# Patient Record
Sex: Male | Born: 1940 | Race: White | Hispanic: No | Smoking: Former smoker
Health system: Southern US, Community
[De-identification: ages and names within clinical notes are randomized; demographics above are authoritative.]

## PROBLEM LIST (undated history)

## (undated) DIAGNOSIS — F329 Major depressive disorder, single episode, unspecified: Secondary | ICD-10-CM

## (undated) DIAGNOSIS — E7800 Pure hypercholesterolemia, unspecified: Secondary | ICD-10-CM

## (undated) DIAGNOSIS — I10 Essential (primary) hypertension: Secondary | ICD-10-CM

## (undated) DIAGNOSIS — F419 Anxiety disorder, unspecified: Secondary | ICD-10-CM

## (undated) DIAGNOSIS — F32A Depression, unspecified: Secondary | ICD-10-CM

## (undated) HISTORY — DX: Anxiety disorder, unspecified: F41.9

## (undated) HISTORY — DX: Depression, unspecified: F32.A

## (undated) HISTORY — DX: Major depressive disorder, single episode, unspecified: F32.9

## (undated) HISTORY — PX: BACK SURGERY: SHX140

## (undated) HISTORY — PX: CYSTOSCOPY W/ RETROGRADES: SHX1426

---

## 2008-08-17 ENCOUNTER — Ambulatory Visit (HOSPITAL_COMMUNITY): Admission: RE | Admit: 2008-08-17 | Discharge: 2008-08-17 | Payer: Self-pay | Admitting: Family Medicine

## 2008-08-19 ENCOUNTER — Ambulatory Visit (HOSPITAL_COMMUNITY): Admission: RE | Admit: 2008-08-19 | Discharge: 2008-08-19 | Payer: Self-pay | Admitting: Family Medicine

## 2008-09-20 ENCOUNTER — Emergency Department (HOSPITAL_COMMUNITY): Admission: EM | Admit: 2008-09-20 | Discharge: 2008-09-20 | Payer: Self-pay | Admitting: Emergency Medicine

## 2009-02-28 ENCOUNTER — Ambulatory Visit (HOSPITAL_COMMUNITY): Admission: RE | Admit: 2009-02-28 | Discharge: 2009-02-28 | Payer: Self-pay | Admitting: Gastroenterology

## 2009-11-25 LAB — HM COLONOSCOPY

## 2010-08-09 ENCOUNTER — Encounter: Payer: Self-pay | Admitting: Family Medicine

## 2010-08-09 DIAGNOSIS — K5790 Diverticulosis of intestine, part unspecified, without perforation or abscess without bleeding: Secondary | ICD-10-CM

## 2010-08-09 DIAGNOSIS — K635 Polyp of colon: Secondary | ICD-10-CM

## 2010-08-22 LAB — URINE MICROSCOPIC-ADD ON

## 2010-08-22 LAB — URINE CULTURE

## 2010-08-22 LAB — URINALYSIS, ROUTINE W REFLEX MICROSCOPIC
Bilirubin Urine: NEGATIVE
Nitrite: POSITIVE — AB

## 2011-02-13 ENCOUNTER — Emergency Department (HOSPITAL_COMMUNITY)
Admission: EM | Admit: 2011-02-13 | Discharge: 2011-02-13 | Disposition: A | Discharge status: Home / Self Care | Payer: Medicare Other | Attending: Emergency Medicine | Admitting: Emergency Medicine

## 2011-02-13 DIAGNOSIS — E7800 Pure hypercholesterolemia, unspecified: Secondary | ICD-10-CM | POA: Insufficient documentation

## 2011-02-13 DIAGNOSIS — I10 Essential (primary) hypertension: Secondary | ICD-10-CM | POA: Insufficient documentation

## 2011-09-08 ENCOUNTER — Emergency Department (HOSPITAL_COMMUNITY)
Admission: EM | Admit: 2011-09-08 | Discharge: 2011-09-08 | Disposition: A | Discharge status: Home / Self Care | Payer: PRIVATE HEALTH INSURANCE | Attending: Emergency Medicine | Admitting: Emergency Medicine

## 2011-09-08 ENCOUNTER — Emergency Department (HOSPITAL_COMMUNITY): Payer: PRIVATE HEALTH INSURANCE

## 2011-09-08 ENCOUNTER — Encounter (HOSPITAL_COMMUNITY): Payer: Self-pay | Admitting: *Deleted

## 2011-09-08 DIAGNOSIS — E119 Type 2 diabetes mellitus without complications: Secondary | ICD-10-CM | POA: Insufficient documentation

## 2011-09-08 DIAGNOSIS — R011 Cardiac murmur, unspecified: Secondary | ICD-10-CM | POA: Insufficient documentation

## 2011-09-08 DIAGNOSIS — R739 Hyperglycemia, unspecified: Secondary | ICD-10-CM

## 2011-09-08 DIAGNOSIS — Z79899 Other long term (current) drug therapy: Secondary | ICD-10-CM | POA: Insufficient documentation

## 2011-09-08 DIAGNOSIS — R5381 Other malaise: Secondary | ICD-10-CM | POA: Insufficient documentation

## 2011-09-08 DIAGNOSIS — E789 Disorder of lipoprotein metabolism, unspecified: Secondary | ICD-10-CM | POA: Insufficient documentation

## 2011-09-08 DIAGNOSIS — R42 Dizziness and giddiness: Secondary | ICD-10-CM | POA: Insufficient documentation

## 2011-09-08 DIAGNOSIS — I10 Essential (primary) hypertension: Secondary | ICD-10-CM | POA: Insufficient documentation

## 2011-09-08 HISTORY — DX: Pure hypercholesterolemia, unspecified: E78.00

## 2011-09-08 HISTORY — DX: Essential (primary) hypertension: I10

## 2011-09-08 LAB — GLUCOSE, CAPILLARY: Glucose-Capillary: 146 mg/dL — ABNORMAL HIGH (ref 70–99)

## 2011-09-08 LAB — CBC
HCT: 41.1 % (ref 39.0–52.0)
MCH: 28.8 pg (ref 26.0–34.0)
RDW: 13.7 % (ref 11.5–15.5)
WBC: 4.3 10*3/uL (ref 4.0–10.5)

## 2011-09-08 LAB — BASIC METABOLIC PANEL
GFR calc Af Amer: >90 mL/min (ref >90)
Potassium: 4.5 mEq/L (ref 3.5–5.1)
Sodium: 141 mEq/L (ref 135–145)

## 2011-09-08 LAB — TROPONIN I: Troponin I: <0.3 ng/mL (ref <0.30)

## 2011-09-08 MED ORDER — SODIUM CHLORIDE 0.9 % IV BOLUS (SEPSIS)
250.0000 mL | Freq: Once | INTRAVENOUS | Status: AC
  Administered 2011-09-08: 250 mL via INTRAVENOUS

## 2011-09-08 MED ORDER — SODIUM CHLORIDE 0.9 % IV SOLN: INTRAVENOUS | Status: DC

## 2011-09-08 NOTE — ED Notes (Signed)
Pt states, "not feeling well" x 1 week. Unable to explain how initially, but then stated "it may be anxiety, it feels like it could be."

## 2011-09-08 NOTE — ED Provider Notes (Addendum)
History   This chart was scribed for Dennis Jakes, MD by Dennis Melton. The patient was seen in room APA05/APA05. Patient's care was started at 0905.    CSN: [id number]  Arrival date & time 09/08/11  0905   First MD Initiated Contact with Patient 09/08/11 [phone]      Chief Complaint  Patient presents with  . Hypertension    (Consider location/radiation/quality/duration/timing/severity/associated sxs/prior treatment) HPI Dennis Melton is a 71 y.o. male who presents to the Emergency Department complaining of intermittent gradual fatigue onset several days ago. Patient states that they have also experienced elevated blood pressure of a measure of 174 systolically onset this morning. Patient denies nausea, HA, sore throat, cough, neck pain, back pain, chest pain, SOB, belly pain, rash, and swelling in legs. Patient list PCP as Dennis Melton at [facility]   Past Medical History  Diagnosis Date  . Hypertension   . Diabetes mellitus   . High cholesterol     Past Surgical History  Procedure Date  . Back surgery     No family history on file.  History  Substance Use Topics  . Smoking status: Former Games developer  . Smokeless tobacco: Not on file  . Alcohol Use: No      Review of Systems  Constitutional: Positive for fatigue. Negative for fever and chills.  HENT: Negative for rhinorrhea and neck pain.   Eyes: Negative for pain.  Respiratory: Negative for cough and shortness of breath.   Cardiovascular: Negative for chest pain.  Gastrointestinal: Negative for nausea, vomiting, abdominal pain and diarrhea.  Genitourinary: Negative for dysuria.  Musculoskeletal: Negative for back pain.  Skin: Negative for rash.  Neurological: Negative for dizziness, weakness and headaches.    Allergies  Review of patient's allergies indicates no known allergies.  Home Medications   Current Outpatient Rx  Name Route Sig Dispense Refill  . ALPRAZOLAM 0.25 MG PO TABS Oral Take 0.25 mg by mouth 3  (three) times daily as needed.      . ATENOLOL 25 MG PO TABS Oral Take 25 mg by mouth daily.      Marland Kitchen VITAMIN D 1000 UNITS PO TABS Oral Take 1,000 Units by mouth daily.      Marland Kitchen TAMSULOSIN HCL 0.4 MG PO CAPS Oral Take by mouth daily.      Marland Kitchen VALSARTAN-HYDROCHLOROTHIAZIDE 160-12.5 MG PO TABS Oral Take 1 tablet by mouth daily.        BP 136/67  Pulse 63  Temp(Src) 98 F (36.7 C) (Oral)  Resp 18  Ht 6' (1.829 m)  Wt 208 lb (94.348 kg)  BMI 28.21 kg/m2  SpO2 94%  Physical Exam  Nursing note and vitals reviewed. Constitutional: He is oriented to person, place, and time. He appears well-developed and well-nourished. No distress.  HENT:  Head: Normocephalic and atraumatic.  Eyes: Conjunctivae and EOM are normal. Pupils are equal, round, and reactive to light.  Neck: Neck supple. No tracheal deviation present.  Cardiovascular: Normal rate and regular rhythm.   Murmur (Systolic) heard. Pulmonary/Chest: Effort normal. No respiratory distress. He has no wheezes. He has no rales. He exhibits no tenderness.  Abdominal: Soft. He exhibits no distension and no mass. There is no tenderness. There is no rebound and no guarding.  Musculoskeletal: Normal range of motion. He exhibits no edema.  Neurological: He is alert and oriented to person, place, and time. No cranial nerve deficit or sensory deficit. Coordination normal.  Skin: Skin is warm and dry.  Psychiatric: He  has a normal mood and affect. His behavior is normal.    ED Course  Procedures (including critical care time) DIAGNOSTIC STUDIES:  COORDINATION OF CARE:  Labs Reviewed  GLUCOSE, CAPILLARY - Abnormal; Notable for the following:    Glucose-Capillary 146 (*)    All other components within normal limits  CBC - Abnormal; Notable for the following:    Platelets 96 (*)    All other components within normal limits  BASIC METABOLIC PANEL - Abnormal; Notable for the following:    Glucose, Bld 153 (*)    GFR calc non Af Amer 82 (*)     All other components within normal limits  TROPONIN I   Dg Chest 2 View  09/08/2011  *RADIOLOGY REPORT*  Clinical Data: Weakness and dizziness.  CHEST - 2 VIEW  Comparison: None.  Findings: The lungs are mildly hyperinflated.  There is some bronchovascular prominence localizing mainly in the left lower lung which may be chronic.  Component of bronchitis may be present. There is no evidence of focal pulmonary consolidation, pleural effusion or pulmonary edema.  Heart is mildly enlarged.  The aorta is ectatic.  The bony thorax is unremarkable.  IMPRESSION: Hyperinflation with bronchovascular prominence in the left lower lung which may be chronic.  Original Report Authenticated By: Reola Calkins, M.D.   Ct Head Wo Contrast  09/08/2011  *RADIOLOGY REPORT*  Clinical Data: Hypertension.  CT HEAD WITHOUT CONTRAST  Technique:  Contiguous axial images were obtained from the base of the skull through the vertex without contrast.  Comparison: None.  Findings: There is diffuse patchy low density throughout the subcortical and periventricular white matter consistent with chronic small vessel ischemic change.  There is prominence of the sulci and ventricles consistent with brain atrophy.  There is no evidence for acute brain infarct, hemorrhage or mass. The paranasal sinuses and mastoid air cells are clear.  The skull appears intact.  IMPRESSION:  1.  No acute intracranial abnormalities. 2.  Small vessel ischemic change and atrophy.  Original Report Authenticated By: Rosealee Albee, M.D.   Results for orders placed during the hospital encounter of 09/08/11  GLUCOSE, CAPILLARY      Component Value Range   Glucose-Capillary 146 (*) 70 - 99 (mg/dL)  CBC      Component Value Range   WBC 4.3  4.0 - 10.5 (K/uL)   RBC 4.86  4.22 - 5.81 (MIL/uL)   Hemoglobin 14.0  13.0 - 17.0 (g/dL)   HCT 16.1  09.6 - 04.5 (%)   MCV 84.6  78.0 - 100.0 (fL)   MCH 28.8  26.0 - 34.0 (pg)   MCHC 34.1  30.0 - 36.0 (g/dL)   RDW 40.9   81.1 - 91.4 (%)   Platelets 96 (*) 150 - 400 (K/uL)  BASIC METABOLIC PANEL      Component Value Range   Sodium 141  135 - 145 (mEq/L)   Potassium 4.5  3.5 - 5.1 (mEq/L)   Chloride 104  96 - 112 (mEq/L)   CO2 26  19 - 32 (mEq/L)   Glucose, Bld 153 (*) 70 - 99 (mg/dL)   BUN 21  6 - 23 (mg/dL)   Creatinine, Ser 7.82  0.50 - 1.35 (mg/dL)   Calcium 9.6  8.4 - 95.6 (mg/dL)   GFR calc non Af Amer 82 (*) >90 (mL/min)   GFR calc Af Amer >90  >90 (mL/min)  TROPONIN I      Component Value Range   Troponin  I <0.30  <0.30 (ng/mL)     1. Hypertension   2. Hyperglycemia      Date: 09/08/2011  Rate: 67  Rhythm: normal sinus rhythm  QRS Axis: normal  Intervals: normal  ST/T Wave abnormalities: nonspecific ST/T changes  Conduction Disutrbances:nonspecific intraventricular conduction delay  Narrative Interpretation:   Old EKG Reviewed: none available    MDM  Workup in the emergency department negative except for borderline elevation in blood glucose at 153. No CBC were leukocytosis abnormalities no electrolyte abnormalities. Cardiac marker negative EKG without acute changes head CT without acute changes chest x-ray without acute changes no evidence of pneumonia or pneumothorax. Patient's blood pressure here has been reasonable with diastolic pressures below 90 and systolic pressures below 160. Patient has primary care doctor to followup with will be seen by them sometime next week for recheck of blood sugar will keep a log on the blood pressure to see if the pressure needs to be taking with the medications and for thyroid testing.  Today no evidence of hypertensive crisis no evidence of stroke no evidence of acute cardiac event to explain his fatigue.  I personally performed the services described in this documentation, which was scribed in my presence. The recorded information has been reviewed and considered.         Dennis Jakes, MD 09/08/11 1110  Dennis Jakes,  MD 09/08/11 [phone]

## 2011-09-08 NOTE — Discharge Instructions (Signed)
Followup with your doctors at [facility] family practice in the next few days for repeat of blood sugar and also for thyroid testing. Keep a log of your blood pressure daily and take that to your primary care doctors. Return for any new or worse symptoms. Particularly return for severe headache and strokelike symptoms chest pain or shortness of breath.

## 2011-09-08 NOTE — ED Notes (Signed)
Reports checked bp this am 164/77.  Reports "not feeling good" for a couple of days.  C/o generalized weakness and dizziness.  Denies nausea/visual disturbances/pain.

## 2012-06-25 ENCOUNTER — Ambulatory Visit (HOSPITAL_COMMUNITY)
Admission: RE | Admit: 2012-06-25 | Discharge: 2012-06-25 | Disposition: A | Discharge status: Home / Self Care | Payer: Medicare Other | Source: Ambulatory Visit | Attending: Family Medicine | Admitting: Family Medicine

## 2012-06-25 DIAGNOSIS — R011 Cardiac murmur, unspecified: Secondary | ICD-10-CM | POA: Insufficient documentation

## 2012-06-25 DIAGNOSIS — I059 Rheumatic mitral valve disease, unspecified: Secondary | ICD-10-CM

## 2012-06-25 NOTE — Progress Notes (Signed)
*  PRELIMINARY RESULTS* Echocardiogram 2D Echocardiogram has been performed.  [place] 06/25/2012, 8:55 AM

## 2012-09-08 ENCOUNTER — Other Ambulatory Visit: Payer: Self-pay | Admitting: *Deleted

## 2012-09-08 MED ORDER — ALPRAZOLAM 0.25 MG PO TABS: 0.2500 mg | ORAL_TABLET | Freq: Three times a day (TID) | ORAL | Status: DC

## 2012-09-08 NOTE — Telephone Encounter (Signed)
Last seen 01/247/14, last filled 07/12/12. If approved have nurse call into [facility]

## 2012-09-08 NOTE — Telephone Encounter (Signed)
RX called to [facility] vm. 

## 2012-09-08 NOTE — Telephone Encounter (Signed)
Okay to refill x1 

## 2012-09-09 ENCOUNTER — Other Ambulatory Visit (INDEPENDENT_AMBULATORY_CARE_PROVIDER_SITE_OTHER): Payer: Medicare Other

## 2012-09-09 DIAGNOSIS — E785 Hyperlipidemia, unspecified: Secondary | ICD-10-CM

## 2012-09-09 DIAGNOSIS — I10 Essential (primary) hypertension: Secondary | ICD-10-CM

## 2012-09-09 DIAGNOSIS — IMO0001 Reserved for inherently not codable concepts without codable children: Secondary | ICD-10-CM

## 2012-09-09 LAB — COMPREHENSIVE METABOLIC PANEL
ALT: 13 U/L (ref 0–53)
AST: 17 U/L (ref 0–37)
Albumin: 4.4 g/dL (ref 3.5–5.2)
Alkaline Phosphatase: 44 U/L (ref 39–117)
BUN: 22 mg/dL (ref 6–23)
CO2: 26 mEq/L (ref 19–32)
Calcium: 9.2 mg/dL (ref 8.4–10.5)
Chloride: 102 mEq/L (ref 96–112)
Creat: 1.21 mg/dL (ref 0.50–1.35)
Glucose, Bld: 110 mg/dL — ABNORMAL HIGH (ref 70–99)
Potassium: 4.3 mEq/L (ref 3.5–5.3)
Sodium: 139 mEq/L (ref 135–145)
Total Bilirubin: 0.4 mg/dL (ref 0.3–1.2)
Total Protein: 6.6 g/dL (ref 6.0–8.3)

## 2012-09-09 LAB — POCT GLYCOSYLATED HEMOGLOBIN (HGB A1C): Hemoglobin A1C: 5.5

## 2012-09-09 LAB — POCT UA - MICROALBUMIN: Microalbumin Ur, POC: NEGATIVE mg/dL

## 2012-09-09 NOTE — Progress Notes (Signed)
Patient came in for labs only.

## 2012-09-10 ENCOUNTER — Other Ambulatory Visit: Payer: Self-pay | Admitting: Nurse Practitioner

## 2012-09-10 LAB — NMR LIPOPROFILE WITH LIPIDS
Cholesterol, Total: 163 mg/dL (ref <200)
HDL Particle Number: 19.3 umol/L — ABNORMAL LOW (ref >=30.5)
HDL Size: 8.3 nm — ABNORMAL LOW (ref >=9.2)
HDL-C: 30 mg/dL — ABNORMAL LOW (ref >=40)
LDL (calc): 118 mg/dL — ABNORMAL HIGH (ref <100)
LDL Particle Number: 1324 nmol/L — ABNORMAL HIGH (ref <1000)
LDL Size: 20 nm — ABNORMAL LOW (ref >20.5)
LP-IR Score: 69 — ABNORMAL HIGH (ref <=45)
Large HDL-P: 1.8 umol/L — ABNORMAL LOW (ref >=4.8)
Large VLDL-P: 2.2 nmol/L (ref <=2.7)
Small LDL Particle Number: 922 nmol/L — ABNORMAL HIGH (ref <=527)
Triglycerides: 76 mg/dL (ref <150)
VLDL Size: 49.1 nm — ABNORMAL HIGH (ref <=46.6)

## 2012-09-10 MED ORDER — ATORVASTATIN CALCIUM 40 MG PO TABS: 40.0000 mg | ORAL_TABLET | Freq: Every day | ORAL | Status: DC

## 2012-09-11 ENCOUNTER — Telehealth: Payer: Self-pay | Admitting: Nurse Practitioner

## 2012-09-12 ENCOUNTER — Other Ambulatory Visit: Payer: Self-pay | Admitting: Family Medicine

## 2012-09-15 NOTE — Telephone Encounter (Signed)
No documentation of call.

## 2012-09-16 ENCOUNTER — Ambulatory Visit (INDEPENDENT_AMBULATORY_CARE_PROVIDER_SITE_OTHER): Payer: Medicare Other | Admitting: Family Medicine

## 2012-09-16 ENCOUNTER — Encounter: Payer: Self-pay | Admitting: Family Medicine

## 2012-09-16 VITALS — BP 118/65 | HR 54 | Temp 97.1°F | Ht 71.0 in | Wt 195.4 lb

## 2012-09-16 DIAGNOSIS — K635 Polyp of colon: Secondary | ICD-10-CM

## 2012-09-16 DIAGNOSIS — E785 Hyperlipidemia, unspecified: Secondary | ICD-10-CM | POA: Insufficient documentation

## 2012-09-16 DIAGNOSIS — I10 Essential (primary) hypertension: Secondary | ICD-10-CM | POA: Insufficient documentation

## 2012-09-16 DIAGNOSIS — R011 Cardiac murmur, unspecified: Secondary | ICD-10-CM | POA: Insufficient documentation

## 2012-09-16 DIAGNOSIS — K5790 Diverticulosis of intestine, part unspecified, without perforation or abscess without bleeding: Secondary | ICD-10-CM

## 2012-09-16 DIAGNOSIS — K5730 Diverticulosis of large intestine without perforation or abscess without bleeding: Secondary | ICD-10-CM

## 2012-09-16 DIAGNOSIS — E119 Type 2 diabetes mellitus without complications: Secondary | ICD-10-CM | POA: Insufficient documentation

## 2012-09-16 DIAGNOSIS — D126 Benign neoplasm of colon, unspecified: Secondary | ICD-10-CM

## 2012-09-16 NOTE — Progress Notes (Signed)
Subjective:     Patient ID: Dennis Melton, male   DOB: [birth year], 73 y.o.   MRN: [id number]  HPI Patient is here for follow up of Diabetes Mellitus/Hypertension/ Hyperlipidemia. Was Rx lipitor and he has been fine with it.  Feels good overall, dieting and keeps active and trying to lose weight so he can come off some of the meds. .Symptoms of DM:has had no Nocturia ,deniesUrinary Frequency ,denies Blurred vision ,deniesDizziness,denies.Dysuria,deniesparesthesias, deniesextremity pain or ulcers.Marland Kitchendenieschest pain. .has hadan annual eye exam. do check the feet. doescheck CBGs. Average CBG:________.Marland Kitchen deniesto episodes of hypoglycemia. doeshave an emergency hypoglycemic plan. admits toCompliance with medications. deniesProblems with medications.  Past Medical History  Diagnosis Date  . Hypertension   . Diabetes mellitus   . High cholesterol    Past Surgical History  Procedure Laterality Date  . Back surgery     History   Social History  . Marital Status: Divorced    Spouse Name: N/A    Number of Children: N/A  . Years of Education: N/A   Occupational History  . Not on file.   Social History Main Topics  . Smoking status: Former Games developer  . Smokeless tobacco: Not on file  . Alcohol Use: No  . Drug Use: No  . Sexually Active:    Other Topics Concern  . Not on file   Social History Narrative  . No narrative on file   No family history on file. Current Outpatient Prescriptions on File Prior to Visit  Medication Sig Dispense Refill  . ALPRAZolam (XANAX) 0.25 MG tablet Take 1 tablet (0.25 mg total) by mouth 3 (three) times daily.  90 tablet  0  . amLODipine (NORVASC) 10 MG tablet Take 10 mg by mouth every evening.      Marland Kitchen aspirin EC 81 MG tablet Take 81 mg by mouth daily.      Marland Kitchen atenolol (TENORMIN) 25 MG tablet Take 25 mg by mouth daily.       Marland Kitchen atorvastatin (LIPITOR) 40 MG tablet Take 1 tablet (40 mg total) by mouth daily.  90 tablet  1  . cholecalciferol (VITAMIN D) 1000  UNITS tablet Take 1,000 Units by mouth daily.       . citalopram (CELEXA) 20 MG tablet TAKE ONE TABLET BY MOUTH ONE TIME DAILY  30 tablet  0  . dutasteride (AVODART) 0.5 MG capsule Take 0.5 mg by mouth daily.      . fenofibrate 54 MG tablet TAKE ONE TABLET BY MOUTH ONE TIME DAILY  30 tablet  2  . metFORMIN (GLUCOPHAGE) 500 MG tablet Take 500 mg by mouth daily with breakfast.       . Omega-3 Fatty Acids (FISH OIL) 1200 MG CAPS Take 1 capsule by mouth 2 (two) times daily.      . Tamsulosin HCl (FLOMAX) 0.4 MG CAPS Take by mouth daily.        . valsartan-hydrochlorothiazide (DIOVAN-HCT) 160-12.5 MG per tablet TAKE ONE TABLET BY MOUTH ONE TIME DAILY  30 tablet  2   No current facility-administered medications on file prior to visit.   No Known Allergies Immunization History  Administered Date(s) Administered  . Pneumococcal Polysaccharide 01/12/2010   Prior to Admission medications   Medication Sig Start Date End Date Taking? Authorizing Provider  ALPRAZolam (XANAX) 0.25 MG tablet Take 1 tablet (0.25 mg total) by mouth 3 (three) times daily. 09/08/12  Yes Ileana Ladd, MD  amLODipine (NORVASC) 10 MG tablet Take 10 mg by mouth every evening.  Yes Historical Provider, MD  aspirin EC 81 MG tablet Take 81 mg by mouth daily.   Yes Historical Provider, MD  atenolol (TENORMIN) 25 MG tablet Take 25 mg by mouth daily.    Yes Historical Provider, MD  atorvastatin (LIPITOR) 40 MG tablet Take 1 tablet (40 mg total) by mouth daily. 09/10/12  Yes Mary-Margaret Daphine Deutscher, FNP  cholecalciferol (VITAMIN D) 1000 UNITS tablet Take 1,000 Units by mouth daily.    Yes Historical Provider, MD  citalopram (CELEXA) 20 MG tablet TAKE ONE TABLET BY MOUTH ONE TIME DAILY 09/12/12  Yes Ileana Ladd, MD  dutasteride (AVODART) 0.5 MG capsule Take 0.5 mg by mouth daily.   Yes Historical Provider, MD  fenofibrate 54 MG tablet TAKE ONE TABLET BY MOUTH ONE TIME DAILY 09/12/12  Yes Ileana Ladd, MD  FREESTYLE LITE test strip 1 each  daily.  08/07/12  Yes Historical Provider, MD  metFORMIN (GLUCOPHAGE) 500 MG tablet Take 500 mg by mouth daily with breakfast.    Yes Historical Provider, MD  Omega-3 Fatty Acids (FISH OIL) 1200 MG CAPS Take 1 capsule by mouth 2 (two) times daily.   Yes Historical Provider, MD  SMART SENSE THIN LANCETS 26G MISC  09/06/12  Yes Historical Provider, MD  Tamsulosin HCl (FLOMAX) 0.4 MG CAPS Take by mouth daily.     Yes Historical Provider, MD  valsartan-hydrochlorothiazide (DIOVAN-HCT) 160-12.5 MG per tablet TAKE ONE TABLET BY MOUTH ONE TIME DAILY 09/12/12  Yes Ileana Ladd, MD     Review of Systems     Objective:   Physical Exam APPEARANCE:  White male looks well Patient in no acute distress.The patient appeared well nourished and normally developed. Acyanotic. Waist:37 inches VITAL SIGNS:BP 118/65  Pulse 54  Temp(Src) 97.1 F (36.2 C) (Oral)  Ht 5\' 11"  (1.803 m)  Wt 195 lb 6.4 oz (88.633 kg)  BMI 27.26 kg/m2   SKIN: warm and  Dry without overt rashes, tattoos and scars  HEAD and Neck: without JVD, Head and scalp: normal Eyes:No scleral icterus. Fundi normal, eye movements normal. Ears: Auricle normal, canal normal, Tympanic membranes normal, insufflation normal. Nose: normal Throat: normal Neck & thyroid: normal  CHEST & LUNGS: Chest wall: normal Lungs: Clear  CVS: Reveals the PMI to be normally located. Regular rhythm, First and Second Heart sounds are normal,  Soft 2/6 systolic murmur at the left sternal border due to mild MR, no rubs or gallops. Peripheral vasculature: Radial pulses: normal Dorsal pedis pulses: normal Posterior pulses: normal  ABDOMEN:  Appearance: normal Benign,, no organomegaly, no masses, no Abdominal Aortic enlargement. No Guarding , no rebound. No Bruits. Bowel sounds: normal  RECTAL: N/A GU: N/A  EXTREMETIES: nonedematous. Both Femoral and Pedal pulses are normal.  MUSCULOSKELETAL:  Spine: normal Joints: intact  NEUROLOGIC:  oriented to time,place and person; nonfocal. Strength is normal Sensory is normal Reflexes are normal Cranial Nerves are normal.     Assessment:    HTN (hypertension) - Plan: Hepatic function panel, BASIC METABOLIC PANEL WITH GFR  HLD (hyperlipidemia) - Plan: Hepatic function panel, BASIC METABOLIC PANEL WITH GFR, NMR Lipoprofile with Lipids  DM (diabetes mellitus) - Plan: POCT glycosylated hemoglobin (Hb A1C)  Diverticulosis  Colon polyposis  Heart murmur, systolic      Plan:     Reviewed all the labs and echocardiogram results. Patient is doing very well. Continue on Dietary changes and exercise.Get his waist size lower. He may need to lower his Metformin and amlodipine  Dose if  he continues to improve.  RTC 3 months with labs prior. Orders Placed This Encounter  Procedures  . Hepatic function panel    Standing Status: Future     Number of Occurrences:      Standing Expiration Date: 09/16/2013  . BASIC METABOLIC PANEL WITH GFR    Standing Status: Future     Number of Occurrences:      Standing Expiration Date: 09/16/2013  . NMR Lipoprofile with Lipids    Standing Status: Future     Number of Occurrences:      Standing Expiration Date: 09/16/2013  . POCT glycosylated hemoglobin (Hb A1C)    To be done in 3 months    Marlen Koman P. [place]esto Charon, M.D.

## 2012-10-04 ENCOUNTER — Other Ambulatory Visit: Payer: Self-pay | Admitting: Family Medicine

## 2012-10-07 ENCOUNTER — Other Ambulatory Visit: Payer: Self-pay | Admitting: Family Medicine

## 2012-11-05 ENCOUNTER — Other Ambulatory Visit: Payer: Self-pay | Admitting: *Deleted

## 2012-11-05 MED ORDER — ALPRAZOLAM 0.25 MG PO TABS: 0.2500 mg | ORAL_TABLET | Freq: Three times a day (TID) | ORAL | Status: DC

## 2012-11-05 NOTE — Telephone Encounter (Signed)
RX called to [facility] vm. 

## 2012-11-05 NOTE — Telephone Encounter (Signed)
LAST RF 10/07/12. CALL IN [facility]. LAST OV 09/16/12.

## 2012-11-05 NOTE — Telephone Encounter (Signed)
Please call in xanax rx with 2 refills 

## 2012-11-08 ENCOUNTER — Other Ambulatory Visit: Payer: Self-pay | Admitting: Family Medicine

## 2012-11-28 ENCOUNTER — Other Ambulatory Visit: Payer: Self-pay | Admitting: Family Medicine

## 2012-12-15 ENCOUNTER — Other Ambulatory Visit (INDEPENDENT_AMBULATORY_CARE_PROVIDER_SITE_OTHER): Payer: Medicare Other

## 2012-12-15 DIAGNOSIS — I10 Essential (primary) hypertension: Secondary | ICD-10-CM

## 2012-12-15 DIAGNOSIS — E785 Hyperlipidemia, unspecified: Secondary | ICD-10-CM

## 2012-12-16 LAB — BASIC METABOLIC PANEL WITH GFR
BUN: 17 mg/dL (ref 6–23)
CO2: 25 mEq/L (ref 19–32)
Calcium: 9 mg/dL (ref 8.4–10.5)
Chloride: 103 mEq/L (ref 96–112)
Creat: 1.22 mg/dL (ref 0.50–1.35)
GFR, Est African American: 69 mL/min
GFR, Est Non African American: 59 mL/min — ABNORMAL LOW
Glucose, Bld: 99 mg/dL (ref 70–99)
Potassium: 4.6 mEq/L (ref 3.5–5.3)
Sodium: 140 mEq/L (ref 135–145)

## 2012-12-16 LAB — HEPATIC FUNCTION PANEL
ALT: 9 U/L (ref 0–53)
AST: 15 U/L (ref 0–37)
Albumin: 4.2 g/dL (ref 3.5–5.2)
Alkaline Phosphatase: 47 U/L (ref 39–117)
Bilirubin, Direct: 0.1 mg/dL (ref 0.0–0.3)
Indirect Bilirubin: 0.3 mg/dL (ref 0.0–0.9)
Total Bilirubin: 0.4 mg/dL (ref 0.3–1.2)
Total Protein: 7 g/dL (ref 6.0–8.3)

## 2012-12-16 LAB — NMR LIPOPROFILE WITH LIPIDS
Cholesterol, Total: 134 mg/dL (ref <200)
HDL Particle Number: 24.2 umol/L — ABNORMAL LOW (ref >=30.5)
HDL Size: 8.9 nm — ABNORMAL LOW (ref >=9.2)
HDL-C: 36 mg/dL — ABNORMAL LOW (ref >=40)
LDL (calc): 86 mg/dL (ref <100)
LDL Particle Number: 1017 nmol/L — ABNORMAL HIGH (ref <1000)
LDL Size: 20.3 nm — ABNORMAL LOW (ref >20.5)
LP-IR Score: 61 — ABNORMAL HIGH (ref <=45)
Large HDL-P: 1.9 umol/L — ABNORMAL LOW (ref >=4.8)
Large VLDL-P: 3.3 nmol/L — ABNORMAL HIGH (ref <=2.7)
Small LDL Particle Number: 583 nmol/L — ABNORMAL HIGH (ref <=527)
Triglycerides: 60 mg/dL (ref <150)
VLDL Size: 50.1 nm — ABNORMAL HIGH (ref <=46.6)

## 2012-12-17 NOTE — Progress Notes (Signed)
Quick Note:  Lab result at goal or very close to goal No change in Medications for now. No Change in plans and follow up. HGBA1C when he comes in for follow up soon. ______

## 2012-12-18 ENCOUNTER — Encounter: Payer: Self-pay | Admitting: Family Medicine

## 2012-12-18 ENCOUNTER — Ambulatory Visit (INDEPENDENT_AMBULATORY_CARE_PROVIDER_SITE_OTHER): Payer: Medicare Other | Admitting: Family Medicine

## 2012-12-18 VITALS — BP 132/67 | HR 58 | Temp 97.2°F | Ht 71.0 in | Wt 196.0 lb

## 2012-12-18 DIAGNOSIS — I10 Essential (primary) hypertension: Secondary | ICD-10-CM

## 2012-12-18 DIAGNOSIS — R011 Cardiac murmur, unspecified: Secondary | ICD-10-CM

## 2012-12-18 DIAGNOSIS — E119 Type 2 diabetes mellitus without complications: Secondary | ICD-10-CM

## 2012-12-18 DIAGNOSIS — E785 Hyperlipidemia, unspecified: Secondary | ICD-10-CM

## 2012-12-18 LAB — POCT GLYCOSYLATED HEMOGLOBIN (HGB A1C): Hemoglobin A1C: 5.2

## 2012-12-18 LAB — POCT UA - MICROALBUMIN: Microalbumin Ur, POC: POSITIVE mg/L

## 2012-12-18 NOTE — Progress Notes (Signed)
Patient ID: Dennis Melton, male   DOB: Oct 05, 1940, 72 y.o.   MRN: [id number] SUBJECTIVE: CC: Chief Complaint  Patient presents with  . Follow-up    3 month follow up  refill valsaartan and fenfobibrarte      HPI: Patient is here for follow up of hyperlipidemia:/HTN/ denies Headache;denies Chest Pain;denies weakness;denies Shortness of Breath and orthopnea;denies Visual changes;denies palpitations;denies cough;denies pedal edema;denies symptoms of TIA or stroke;deniesClaudication symptoms. admits to Compliance with medications; denies Problems with medications.  Patient is here for follow up of Diabetes Mellitus: Symptoms of DM: Denies Nocturia ,Denies Urinary Frequency , denies Blurred vision ,deniesDizziness,denies.Dysuria,denies paresthesias, denies extremity pain or ulcers.Marland Kitchendenies chest pain. has had an annual eye exam. do check the feet. Does check CBGs. Average CBG: Denies episodes of hypoglycemia. Does have an emergency hypoglycemic plan.   Breakfast: oatmeal 1 minute Lunch: snacks, nuts, apple or peach Supper: green beans , chicken admits to Compliance with medications. Denies Problems with medications.  Past Medical History  Diagnosis Date  . Hypertension   . Diabetes mellitus   . High cholesterol   . Anxiety   . Depression    Past Surgical History  Procedure Laterality Date  . Back surgery     History   Social History  . Marital Status: Divorced    Spouse Name: N/A    Number of Children: N/A  . Years of Education: N/A   Occupational History  . Not on file.   Social History Main Topics  . Smoking status: Former Games developer  . Smokeless tobacco: Not on file  . Alcohol Use: No  . Drug Use: No  . Sexually Active:    Other Topics Concern  . Not on file   Social History Narrative  . No narrative on file   No family history on file. Current Outpatient Prescriptions on File Prior to Visit  Medication Sig Dispense Refill  . ALPRAZolam (XANAX) 0.25 MG  tablet Take 1 tablet (0.25 mg total) by mouth 3 (three) times daily.  90 tablet  2  . amLODipine (NORVASC) 10 MG tablet TAKE  ONE TABLET BY MOUTH NIGHTLY AT BEDTIME  30 tablet  4  . aspirin EC 81 MG tablet Take 81 mg by mouth daily.      Marland Kitchen atenolol (TENORMIN) 25 MG tablet TAKE  ONE TABLET BY MOUTH EVERY MORNING  30 tablet  2  . citalopram (CELEXA) 20 MG tablet TAKE ONE TABLET BY MOUTH ONE TIME DAILY  30 tablet  2  . dutasteride (AVODART) 0.5 MG capsule Take 0.5 mg by mouth daily.      . fenofibrate 54 MG tablet TAKE ONE TABLET BY MOUTH ONE TIME DAILY  30 tablet  2  . FREESTYLE LITE test strip 1 each daily.       . metFORMIN (GLUCOPHAGE) 500 MG tablet Take 500 mg by mouth daily with breakfast.       . Tamsulosin HCl (FLOMAX) 0.4 MG CAPS Take by mouth daily.        . valsartan-hydrochlorothiazide (DIOVAN-HCT) 160-12.5 MG per tablet TAKE ONE TABLET BY MOUTH ONE TIME DAILY  30 tablet  1  . Omega-3 Fatty Acids (FISH OIL) 1200 MG CAPS Take 1 capsule by mouth 2 (two) times daily.      Marland Kitchen SMART SENSE THIN LANCETS 26G MISC        No current facility-administered medications on file prior to visit.   No Known Allergies Immunization History  Administered Date(s) Administered  . Pneumococcal Polysaccharide 01/12/2010  .  Zoster 11/20/2010   Prior to Admission medications   Medication Sig Start Date End Date Taking? Authorizing Provider  ALPRAZolam (XANAX) 0.25 MG tablet Take 1 tablet (0.25 mg total) by mouth 3 (three) times daily. 11/05/12  Yes Mary-Margaret Daphine Deutscher, FNP  amLODipine (NORVASC) 10 MG tablet TAKE  ONE TABLET BY MOUTH NIGHTLY AT BEDTIME 10/07/12  Yes Ileana Ladd, MD  aspirin EC 81 MG tablet Take 81 mg by mouth daily.   Yes Historical Provider, MD  atenolol (TENORMIN) 25 MG tablet TAKE  ONE TABLET BY MOUTH EVERY MORNING 11/08/12  Yes Ernestina Penna, MD  citalopram (CELEXA) 20 MG tablet TAKE ONE TABLET BY MOUTH ONE TIME DAILY 10/04/12  Yes Ileana Ladd, MD  dutasteride (AVODART) 0.5 MG  capsule Take 0.5 mg by mouth daily.   Yes Historical Provider, MD  fenofibrate 54 MG tablet TAKE ONE TABLET BY MOUTH ONE TIME DAILY 11/28/12  Yes Ileana Ladd, MD  FREESTYLE LITE test strip 1 each daily.  08/07/12  Yes Historical Provider, MD  metFORMIN (GLUCOPHAGE) 500 MG tablet Take 500 mg by mouth daily with breakfast.    Yes Historical Provider, MD  Tamsulosin HCl (FLOMAX) 0.4 MG CAPS Take by mouth daily.     Yes Historical Provider, MD  valsartan-hydrochlorothiazide (DIOVAN-HCT) 160-12.5 MG per tablet TAKE ONE TABLET BY MOUTH ONE TIME DAILY 11/28/12  Yes Ileana Ladd, MD  Omega-3 Fatty Acids (FISH OIL) 1200 MG CAPS Take 1 capsule by mouth 2 (two) times daily.    Historical Provider, MD  SMART SENSE THIN LANCETS 26G MISC  09/06/12   Historical Provider, MD     ROS: As above in the HPI. All other systems are stable or negative.  OBJECTIVE: APPEARANCE:  Patient in no acute distress.The patient appeared well nourished and normally developed. Acyanotic. Waist: VITAL SIGNS:BP 132/67  Pulse 58  Temp(Src) 97.2 F (36.2 C) (Oral)  Ht 5\' 11"  (1.803 m)  Wt 196 lb (88.905 kg)  BMI 27.35 kg/m2 WM  SKIN: warm and  Dry without overt rashes, tattoos and scars  HEAD and Neck: without JVD, Head and scalp: normal Eyes:No scleral icterus. Fundi normal, eye movements normal. Ears: Auricle normal, canal normal, Tympanic membranes normal, insufflation normal. Nose: normal Throat: normal Neck & thyroid: normal  CHEST & LUNGS: Chest wall: normal Lungs: Clear  CVS: Reveals the PMI to be normally located. Regular rhythm, First and Second Heart sounds are normal,  absence of murmurs, rubs or gallops. Peripheral vasculature: Radial pulses: normal Dorsal pedis pulses: normal Posterior pulses: normal  ABDOMEN:  Appearance: normal Benign, no organomegaly, no masses, no Abdominal Aortic enlargement. No Guarding , no rebound. No Bruits. Bowel sounds: normal  RECTAL: N/A GU:  N/A  EXTREMETIES: nonedematous. Both Femoral and Pedal pulses are normal.  MUSCULOSKELETAL:  Spine: normal Joints: intact  NEUROLOGIC: oriented to time,place and person; nonfocal. Strength is normal Sensory is normal Reflexes are normal Cranial Nerves are normal.  ASSESSMENT: DM (diabetes mellitus) - Plan: POCT glycosylated hemoglobin (Hb A1C), POCT UA - Microalbumin, Microalbumin, urine  HLD (hyperlipidemia)  HTN (hypertension)  Heart murmur, systolic    PLAN: Orders Placed This Encounter  Procedures  . Microalbumin, urine  . POCT glycosylated hemoglobin (Hb A1C)  . POCT UA - Microalbumin   Meds ordered this encounter  Medications  . DISCONTD: FREESTYLE LITE test strip    Sig:    Results for orders placed in visit on 12/18/12  POCT GLYCOSYLATED HEMOGLOBIN (HGB A1C)  Result Value Range   Hemoglobin A1C 5.2%    POCT UA - MICROALBUMIN      Result Value Range   Microalbumin Ur, POC positive          Dr Woodroe Mode Recommendations  Diet and Exercise discussed with patient.  For nutrition information, I recommend books:  1).Eat to Live by Dr Monico Hoar. 2).Prevent and Reverse Heart Disease by Dr Suzzette Righter. 3) Dr Katherina Right Book:  Program to Reverse Diabetes  Exercise recommendations are:  If unable to walk, then the patient can exercise in a chair 3 times a day. By flapping arms like a bird gently and raising legs outwards to the front.  If ambulatory, the patient can go for walks for 30 minutes 3 times a week. Then increase the intensity and duration as tolerated.  Goal is to try to attain exercise frequency to 5 times a week.  If applicable: Best to perform resistance exercises (machines or weights) 2 days a week and cardio type exercises 3 days per week.  Return in about 3 months (around 03/20/2013) for Recheck medical problems.  Pamelyn Bancroft P. Modesto Charon, M.D.

## 2012-12-18 NOTE — Patient Instructions (Addendum)
      Dr Cheresa Siers's Recommendations  Diet and Exercise discussed with patient.  For nutrition information, I recommend books:  1).Eat to Live by Dr Joel Fuhrman. 2).Prevent and Reverse Heart Disease by Dr Caldwell Esselstyn. 3) Dr Neal Barnard's Book:  Program to Reverse Diabetes  Exercise recommendations are:  If unable to walk, then the patient can exercise in a chair 3 times a day. By flapping arms like a bird gently and raising legs outwards to the front.  If ambulatory, the patient can go for walks for 30 minutes 3 times a week. Then increase the intensity and duration as tolerated.  Goal is to try to attain exercise frequency to 5 times a week.  If applicable: Best to perform resistance exercises (machines or weights) 2 days a week and cardio type exercises 3 days per week.  

## 2012-12-19 NOTE — Progress Notes (Signed)
Patient came in for labs only.

## 2012-12-31 ENCOUNTER — Other Ambulatory Visit: Payer: Self-pay | Admitting: Family Medicine

## 2013-01-27 ENCOUNTER — Encounter: Payer: Self-pay | Admitting: *Deleted

## 2013-01-31 ENCOUNTER — Other Ambulatory Visit: Payer: Self-pay | Admitting: Family Medicine

## 2013-02-04 ENCOUNTER — Other Ambulatory Visit: Payer: Self-pay | Admitting: *Deleted

## 2013-02-04 NOTE — Telephone Encounter (Signed)
LAST OV 12/18/12. LAST RF8/25/14. CALL IN [facility] [place]

## 2013-02-05 ENCOUNTER — Other Ambulatory Visit: Payer: Self-pay | Admitting: Family Medicine

## 2013-02-05 MED ORDER — ALPRAZOLAM 0.25 MG PO TABS: 0.2500 mg | ORAL_TABLET | Freq: Three times a day (TID) | ORAL | Status: DC

## 2013-02-05 NOTE — Telephone Encounter (Signed)
Prescription renewed in [facility]. 

## 2013-02-05 NOTE — Telephone Encounter (Signed)
Rx ready for Phone in. 

## 2013-02-05 NOTE — Telephone Encounter (Signed)
Last seen 06/09/12  [facility]   If approved route to nurse to phone into [facility]

## 2013-02-05 NOTE — Telephone Encounter (Signed)
rx called for alprazolam called to [facility].

## 2013-02-11 NOTE — Telephone Encounter (Signed)
Called Xanax into pharmacy and left authorization on voicemail.

## 2013-03-04 ENCOUNTER — Other Ambulatory Visit: Payer: Self-pay | Admitting: Family Medicine

## 2013-03-10 ENCOUNTER — Other Ambulatory Visit: Payer: Self-pay | Admitting: *Deleted

## 2013-03-10 NOTE — Telephone Encounter (Signed)
Last filled 02/05/13, last seen 12/18/12. Route to pool A if approved, call into [facility]

## 2013-03-12 ENCOUNTER — Other Ambulatory Visit: Payer: Self-pay | Admitting: Family Medicine

## 2013-03-12 DIAGNOSIS — F411 Generalized anxiety disorder: Secondary | ICD-10-CM | POA: Insufficient documentation

## 2013-03-12 MED ORDER — ALPRAZOLAM 0.25 MG PO TABS: 0.2500 mg | ORAL_TABLET | Freq: Three times a day (TID) | ORAL | Status: DC

## 2013-03-12 NOTE — Telephone Encounter (Signed)
Rx ready for pick up. 

## 2013-03-12 NOTE — Telephone Encounter (Signed)
Pt notified rx at front desk

## 2013-03-12 NOTE — Telephone Encounter (Signed)
Pt.notified

## 2013-03-20 ENCOUNTER — Ambulatory Visit (INDEPENDENT_AMBULATORY_CARE_PROVIDER_SITE_OTHER): Payer: Medicare Other | Admitting: Family Medicine

## 2013-03-20 ENCOUNTER — Encounter: Payer: Self-pay | Admitting: Family Medicine

## 2013-03-20 VITALS — BP 114/66 | HR 54 | Temp 97.6°F | Ht 71.0 in | Wt 188.0 lb

## 2013-03-20 DIAGNOSIS — Z23 Encounter for immunization: Secondary | ICD-10-CM

## 2013-03-20 DIAGNOSIS — D126 Benign neoplasm of colon, unspecified: Secondary | ICD-10-CM

## 2013-03-20 DIAGNOSIS — E119 Type 2 diabetes mellitus without complications: Secondary | ICD-10-CM

## 2013-03-20 DIAGNOSIS — E785 Hyperlipidemia, unspecified: Secondary | ICD-10-CM

## 2013-03-20 DIAGNOSIS — F411 Generalized anxiety disorder: Secondary | ICD-10-CM

## 2013-03-20 DIAGNOSIS — I10 Essential (primary) hypertension: Secondary | ICD-10-CM

## 2013-03-20 DIAGNOSIS — R011 Cardiac murmur, unspecified: Secondary | ICD-10-CM

## 2013-03-20 DIAGNOSIS — K635 Polyp of colon: Secondary | ICD-10-CM

## 2013-03-20 LAB — POCT GLYCOSYLATED HEMOGLOBIN (HGB A1C): Hemoglobin A1C: 5.4

## 2013-03-20 NOTE — Progress Notes (Signed)
Patient ID: Dennis Melton, male   DOB: Apr 11, 1941, 72 y.o.   MRN: [id number] SUBJECTIVE: CC: Chief Complaint  Patient presents with  . Follow-up    3 month f/u    HPI: Patient is here for follow up of Diabetes Mellitus: Symptoms evaluated: Denies Nocturia ,Denies Urinary Frequency , denies Blurred vision ,deniesDizziness,denies.Dysuria,denies paresthesias, denies extremity pain or ulcers.Marland Kitchendenies chest pain. has had an annual eye exam. do check the feet. Does check CBGs. Average WUJ:WJXBJY Denies episodes of hypoglycemia. Does have an emergency hypoglycemic plan. admits toCompliance with medications.has been off of metformin due to diet and exercise. Denies Problems with medications.  Past Medical History  Diagnosis Date  . Hypertension   . Diabetes mellitus   . High cholesterol   . Anxiety   . Depression    Past Surgical History  Procedure Laterality Date  . Back surgery     History   Social History  . Marital Status: Divorced    Spouse Name: N/A    Number of Children: N/A  . Years of Education: N/A   Occupational History  . Not on file.   Social History Main Topics  . Smoking status: Former Games developer  . Smokeless tobacco: Not on file  . Alcohol Use: No  . Drug Use: No  . Sexual Activity: Not on file   Other Topics Concern  . Not on file   Social History Narrative  . No narrative on file   Family History  Problem Relation Age of Onset  . Diabetes Father   . Anxiety disorder Brother   . Heart disease Brother    Current Outpatient Prescriptions on File Prior to Visit  Medication Sig Dispense Refill  . ALPRAZolam (XANAX) 0.25 MG tablet Take 1 tablet (0.25 mg total) by mouth 3 (three) times daily.  90 tablet  1  . amLODipine (NORVASC) 10 MG tablet TAKE ONE TABLET BY MOUTH AT BEDTIME  30 tablet  3  . aspirin EC 81 MG tablet Take 81 mg by mouth daily.      Marland Kitchen atenolol (TENORMIN) 25 MG tablet TAKE  ONE TABLET BY MOUTH EVERY MORNING  30 tablet  3  . citalopram  (CELEXA) 20 MG tablet TAKE ONE TABLET BY MOUTH ONE  TIME DAILY  30 tablet  2  . dutasteride (AVODART) 0.5 MG capsule Take 0.5 mg by mouth daily.      . fenofibrate 54 MG tablet TAKE ONE TABLET BY MOUTH ONE TIME DAILY  30 tablet  3  . FREESTYLE LITE test strip 1 each daily.       . Omega-3 Fatty Acids (FISH OIL) 1200 MG CAPS Take 1 capsule by mouth 2 (two) times daily.      Marland Kitchen SMART SENSE THIN LANCETS 26G MISC       . Tamsulosin HCl (FLOMAX) 0.4 MG CAPS Take by mouth daily.        . valsartan-hydrochlorothiazide (DIOVAN-HCT) 160-12.5 MG per tablet TAKE ONE TABLET BY MOUTH ONE  TIME DAILY  30 tablet  4   No current facility-administered medications on file prior to visit.   No Known Allergies Immunization History  Administered Date(s) Administered  . Influenza,inj,Quad PF,36+ Mos 03/20/2013  . Pneumococcal Polysaccharide 01/12/2010  . Zoster 11/20/2010   Prior to Admission medications   Medication Sig Start Date End Date Taking? Authorizing Provider  ALPRAZolam (XANAX) 0.25 MG tablet Take 1 tablet (0.25 mg total) by mouth 3 (three) times daily. 03/10/13   Ileana Ladd, MD  ALPRAZolam (  XANAX) 0.25 MG tablet Take 1 tablet (0.25 mg total) by mouth 3 (three) times daily. 03/12/13   Ileana Ladd, MD  amLODipine (NORVASC) 10 MG tablet TAKE ONE TABLET BY MOUTH AT BEDTIME 03/04/13   Ernestina Penna, MD  aspirin EC 81 MG tablet Take 81 mg by mouth daily.    Historical Provider, MD  atenolol (TENORMIN) 25 MG tablet TAKE  ONE TABLET BY MOUTH EVERY MORNING 03/04/13   Ernestina Penna, MD  citalopram (CELEXA) 20 MG tablet TAKE ONE TABLET BY MOUTH ONE  TIME DAILY 12/31/12   Ileana Ladd, MD  dutasteride (AVODART) 0.5 MG capsule Take 0.5 mg by mouth daily.    Historical Provider, MD  fenofibrate 54 MG tablet TAKE ONE TABLET BY MOUTH ONE TIME DAILY 03/04/13   Ernestina Penna, MD  FREESTYLE LITE test strip 1 each daily.  08/07/12   Historical Provider, MD  metFORMIN (GLUCOPHAGE) 500 MG tablet Take 500 mg by  mouth daily with breakfast.     Historical Provider, MD  Omega-3 Fatty Acids (FISH OIL) 1200 MG CAPS Take 1 capsule by mouth 2 (two) times daily.    Historical Provider, MD  SMART SENSE THIN LANCETS 26G MISC  09/06/12   Historical Provider, MD  Tamsulosin HCl (FLOMAX) 0.4 MG CAPS Take by mouth daily.      Historical Provider, MD  valsartan-hydrochlorothiazide (DIOVAN-HCT) 160-12.5 MG per tablet TAKE ONE TABLET BY MOUTH ONE  TIME DAILY 01/31/13   Ernestina Penna, MD     ROS: As above in the HPI. All other systems are stable or negative.  OBJECTIVE: APPEARANCE:  Patient in no acute distress.The patient appeared well nourished and normally developed. Acyanotic. Waist: VITAL SIGNS:BP 114/66  Pulse 54  Temp(Src) 97.6 F (36.4 C) (Oral)  Ht 5\' 11"  (1.803 m)  Wt 188 lb (85.276 kg)  BMI 26.23 kg/m2  WM  SKIN: warm and  Dry without overt rashes, tattoos and scars  HEAD and Neck: without JVD, Head and scalp: normal Eyes:No scleral icterus. Fundi normal, eye movements normal. Ears: Auricle normal, canal normal, Tympanic membranes normal, insufflation normal. Nose: normal Throat: normal Neck & thyroid: normal  CHEST & LUNGS: Chest wall: normal Lungs: Clear  CVS: Reveals the PMI to be normally located. Regular rhythm, First and Second Heart sounds are normal,  absence of murmurs, rubs or gallops. Peripheral vasculature: Radial pulses: normal Dorsal pedis pulses: normal Posterior pulses: normal  ABDOMEN:  Appearance: normal Benign, no organomegaly, no masses, no Abdominal Aortic enlargement. No Guarding , no rebound. No Bruits. Bowel sounds: normal  RECTAL: N/A GU: N/A  EXTREMETIES: nonedematous.  MUSCULOSKELETAL:  Spine: normal Joints: intact  NEUROLOGIC: oriented to time,place and person; nonfocal. Strength is normal Sensory is normal Reflexes are normal Cranial Nerves are normal.  ASSESSMENT: DM (diabetes mellitus) - Plan: POCT glycosylated hemoglobin (Hb  A1C), CMP14+EGFR  HLD (hyperlipidemia) - Plan: CMP14+EGFR, Lipid panel  HTN (hypertension) - Plan: CMP14+EGFR  Colon polyposis  Heart murmur, systolic  Generalized anxiety disorder  Need for prophylactic vaccination and inoculation against influenza  PLAN:  Orders Placed This Encounter  Procedures  . CMP14+EGFR  . Lipid panel  . POCT glycosylated hemoglobin (Hb A1C)   Continue with lifestyle/dietary changes and exercise. No orders of the defined types were placed in this encounter.   Medications Discontinued During This Encounter  Medication Reason  . ALPRAZolam (XANAX) 0.25 MG tablet Duplicate  . metFORMIN (GLUCOPHAGE) 500 MG tablet Change in therapy   Return  in about 3 months (around 06/20/2013) for Recheck medical problems.  Cleotilde Spadaccini P. Modesto Charon, M.D.

## 2013-03-21 LAB — CMP14+EGFR
ALT: 11 IU/L (ref 0–44)
AST: 17 IU/L (ref 0–40)
Albumin/Globulin Ratio: 2 (ref 1.1–2.5)
Albumin: 4.7 g/dL (ref 3.5–4.8)
Alkaline Phosphatase: 62 IU/L (ref 39–117)
BUN/Creatinine Ratio: 17 (ref 10–22)
BUN: 21 mg/dL (ref 8–27)
CO2: 25 mmol/L (ref 18–29)
Calcium: 9.1 mg/dL (ref 8.6–10.2)
Chloride: 102 mmol/L (ref 97–108)
Creatinine, Ser: 1.21 mg/dL (ref 0.76–1.27)
GFR calc Af Amer: 69 mL/min/{1.73_m2} (ref >59)
GFR calc non Af Amer: 60 mL/min/{1.73_m2} (ref >59)
Globulin, Total: 2.4 g/dL (ref 1.5–4.5)
Glucose: 90 mg/dL (ref 65–99)
Potassium: 4.2 mmol/L (ref 3.5–5.2)
Sodium: 142 mmol/L (ref 134–144)
Total Bilirubin: 0.3 mg/dL (ref 0.0–1.2)
Total Protein: 7.1 g/dL (ref 6.0–8.5)

## 2013-03-21 LAB — LIPID PANEL
Chol/HDL Ratio: 5.6 ratio units — ABNORMAL HIGH (ref 0.0–5.0)
Cholesterol, Total: 168 mg/dL (ref 100–199)
HDL: 30 mg/dL — ABNORMAL LOW (ref >39)
LDL Calculated: 118 mg/dL — ABNORMAL HIGH (ref 0–99)
Triglycerides: 100 mg/dL (ref 0–149)
VLDL Cholesterol Cal: 20 mg/dL (ref 5–40)

## 2013-03-27 ENCOUNTER — Ambulatory Visit: Payer: Medicare Other | Admitting: Family Medicine

## 2013-04-01 ENCOUNTER — Other Ambulatory Visit: Payer: Self-pay | Admitting: Family Medicine

## 2013-05-05 ENCOUNTER — Telehealth: Payer: Self-pay | Admitting: Family Medicine

## 2013-05-12 ENCOUNTER — Other Ambulatory Visit: Payer: Self-pay | Admitting: *Deleted

## 2013-05-12 NOTE — Telephone Encounter (Signed)
Patient last seen in office on 03-20-13. Rx last filled on 04-15-13. Please advise. If approved please route to Pool A so nurse can phone in to [facility]

## 2013-05-15 MED ORDER — ALPRAZOLAM 0.25 MG PO TABS: 0.2500 mg | ORAL_TABLET | Freq: Three times a day (TID) | ORAL | Status: DC

## 2013-05-15 NOTE — Telephone Encounter (Signed)
rx called to [facility]  

## 2013-05-15 NOTE — Telephone Encounter (Signed)
Rx ready for nurse to Phone in. 

## 2013-06-05 ENCOUNTER — Encounter (HOSPITAL_COMMUNITY): Payer: Self-pay | Admitting: Pharmacy Technician

## 2013-06-09 ENCOUNTER — Encounter (HOSPITAL_COMMUNITY)
Admission: RE | Admit: 2013-06-09 | Discharge: 2013-06-09 | Disposition: A | Discharge status: Home / Self Care | Payer: Medicare Other | Source: Ambulatory Visit | Attending: Ophthalmology | Admitting: Ophthalmology

## 2013-06-09 ENCOUNTER — Other Ambulatory Visit: Payer: Self-pay

## 2013-06-09 ENCOUNTER — Encounter (HOSPITAL_COMMUNITY): Payer: Self-pay

## 2013-06-09 DIAGNOSIS — Z01818 Encounter for other preprocedural examination: Secondary | ICD-10-CM | POA: Insufficient documentation

## 2013-06-09 DIAGNOSIS — Z01812 Encounter for preprocedural laboratory examination: Secondary | ICD-10-CM | POA: Insufficient documentation

## 2013-06-09 LAB — HEMOGLOBIN AND HEMATOCRIT, BLOOD
HEMATOCRIT: 38.9 % — AB (ref 39.0–52.0)
HEMOGLOBIN: 13.5 g/dL (ref 13.0–17.0)

## 2013-06-09 LAB — BASIC METABOLIC PANEL
BUN: 22 mg/dL (ref 6–23)
CALCIUM: 9.3 mg/dL (ref 8.4–10.5)
CO2: 26 mEq/L (ref 19–32)
CREATININE: 1.31 mg/dL (ref 0.50–1.35)
Chloride: 102 mEq/L (ref 96–112)
GFR calc non Af Amer: 53 mL/min — ABNORMAL LOW (ref >90)
GFR, EST AFRICAN AMERICAN: 62 mL/min — AB (ref >90)
Glucose, Bld: 102 mg/dL — ABNORMAL HIGH (ref 70–99)
Potassium: 4.6 mEq/L (ref 3.7–5.3)
Sodium: 142 mEq/L (ref 137–147)

## 2013-06-09 NOTE — Patient Instructions (Signed)
Your procedure is scheduled on: 06/18/2013  Report to [facility] at  0700  AM.  Call this number if you have problems the morning of surgery: [phone]   Do not eat food or drink liquids :After Midnight.      Take these medicines the morning of surgery with A SIP OF WATER: xanax, flomax, norvasc, atenolol, celexa, avodart, diovan   Do not wear jewelry, make-up or nail polish.  Do not wear lotions, powders, or perfumes.   Do not shave 48 hours prior to surgery.  Do not bring valuables to the hospital.  Contacts, dentures or bridgework may not be worn into surgery.  Leave suitcase in the car. After surgery it may be brought to your room.  For patients admitted to the hospital, checkout time is 11:00 AM the day of discharge.   Patients discharged the day of surgery will not be allowed to drive home.  :     Please read over the following fact sheets that you were given: Coughing and Deep Breathing, Surgical Site Infection Prevention, Anesthesia Post-op Instructions and Care and Recovery After Surgery    Cataract A cataract is a clouding of the lens of the eye. When a lens becomes cloudy, vision is reduced based on the degree and nature of the clouding. Many cataracts reduce vision to some degree. Some cataracts make people more near-sighted as they develop. Other cataracts increase glare. Cataracts that are ignored and become worse can sometimes look white. The white color can be seen through the pupil. CAUSES   Aging. However, cataracts may occur at any age, even in newborns.   Certain drugs.   Trauma to the eye.   Certain diseases such as diabetes.   Specific eye diseases such as chronic inflammation inside the eye or a sudden attack of a rare form of glaucoma.   Inherited or acquired medical problems.  SYMPTOMS   Gradual, progressive drop in vision in the affected eye.   Severe, rapid visual loss. This most often happens when trauma is the cause.  DIAGNOSIS  To detect a  cataract, an eye doctor examines the lens. Cataracts are best diagnosed with an exam of the eyes with the pupils enlarged (dilated) by drops.  TREATMENT  For an early cataract, vision may improve by using different eyeglasses or stronger lighting. If that does not help your vision, surgery is the only effective treatment. A cataract needs to be surgically removed when vision loss interferes with your everyday activities, such as driving, reading, or watching TV. A cataract may also have to be removed if it prevents examination or treatment of another eye problem. Surgery removes the cloudy lens and usually replaces it with a substitute lens (intraocular lens, IOL).  At a time when both you and your doctor agree, the cataract will be surgically removed. If you have cataracts in both eyes, only one is usually removed at a time. This allows the operated eye to heal and be out of danger from any possible problems after surgery (such as infection or poor wound healing). In rare cases, a cataract may be doing damage to your eye. In these cases, your caregiver may advise surgical removal right away. The vast majority of people who have cataract surgery have better vision afterward. HOME CARE INSTRUCTIONS  If you are not planning surgery, you may be asked to do the following:  Use different eyeglasses.   Use stronger or brighter lighting.   Ask your eye doctor about reducing your  medicine dose or changing medicines if it is thought that a medicine caused your cataract. Changing medicines does not make the cataract go away on its own.   Become familiar with your surroundings. Poor vision can lead to injury. Avoid bumping into things on the affected side. You are at a higher risk for tripping or falling.   Exercise extreme care when driving or operating machinery.   Wear sunglasses if you are sensitive to bright light or experiencing problems with glare.  SEEK IMMEDIATE MEDICAL CARE IF:   You have a  worsening or sudden vision loss.   You notice redness, swelling, or increasing pain in the eye.   You have a fever.  Document Released: 04/30/2005 Document Revised: 04/19/2011 Document Reviewed: 12/22/2010 [facility] Patient Information 2012 [place].PATIENT INSTRUCTIONS POST-ANESTHESIA  IMMEDIATELY FOLLOWING SURGERY:  Do not drive or operate machinery for the first twenty four hours after surgery.  Do not make any important decisions for twenty four hours after surgery or while taking narcotic pain medications or sedatives.  If you develop intractable nausea and vomiting or a severe headache please notify your doctor immediately.  FOLLOW-UP:  Please make an appointment with your surgeon as instructed. You do not need to follow up with anesthesia unless specifically instructed to do so.  WOUND CARE INSTRUCTIONS (if applicable):  Keep a dry clean dressing on the anesthesia/puncture wound site if there is drainage.  Once the wound has quit draining you may leave it open to air.  Generally you should leave the bandage intact for twenty four hours unless there is drainage.  If the epidural site drains for more than 36-48 hours please call the anesthesia department.  QUESTIONS?:  Please feel free to call your physician or the hospital operator if you have any questions, and they will be happy to assist you.

## 2013-06-17 MED ORDER — CYCLOPENTOLATE-PHENYLEPHRINE OP SOLN OPTIME - NO CHARGE
OPHTHALMIC | Status: AC
  Filled 2013-06-17: qty 2

## 2013-06-17 MED ORDER — LIDOCAINE HCL 3.5 % OP GEL
OPHTHALMIC | Status: AC
  Filled 2013-06-17: qty 1

## 2013-06-17 MED ORDER — LIDOCAINE HCL (PF) 1 % IJ SOLN
INTRAMUSCULAR | Status: AC
  Filled 2013-06-17: qty 2

## 2013-06-17 MED ORDER — NEOMYCIN-POLYMYXIN-DEXAMETH 3.5-10000-0.1 OP SUSP
OPHTHALMIC | Status: AC
  Filled 2013-06-17: qty 5

## 2013-06-17 MED ORDER — TETRACAINE HCL 0.5 % OP SOLN
OPHTHALMIC | Status: AC
  Filled 2013-06-17: qty 2

## 2013-06-17 MED ORDER — PHENYLEPHRINE HCL 2.5 % OP SOLN
OPHTHALMIC | Status: AC
  Filled 2013-06-17: qty 15

## 2013-06-18 ENCOUNTER — Encounter (HOSPITAL_COMMUNITY)
Admission: RE | Disposition: A | Discharge status: Home / Self Care | Payer: Self-pay | Source: Ambulatory Visit | Attending: Ophthalmology

## 2013-06-18 ENCOUNTER — Encounter (HOSPITAL_COMMUNITY): Payer: Medicare Other | Admitting: Anesthesiology

## 2013-06-18 ENCOUNTER — Ambulatory Visit (HOSPITAL_COMMUNITY): Payer: Medicare Other | Admitting: Anesthesiology

## 2013-06-18 ENCOUNTER — Ambulatory Visit (HOSPITAL_COMMUNITY)
Admission: RE | Admit: 2013-06-18 | Discharge: 2013-06-18 | Disposition: A | Discharge status: Home / Self Care | Payer: Medicare Other | Source: Ambulatory Visit | Attending: Ophthalmology | Admitting: Ophthalmology

## 2013-06-18 ENCOUNTER — Encounter (HOSPITAL_COMMUNITY): Payer: Self-pay | Admitting: *Deleted

## 2013-06-18 DIAGNOSIS — F17200 Nicotine dependence, unspecified, uncomplicated: Secondary | ICD-10-CM | POA: Insufficient documentation

## 2013-06-18 DIAGNOSIS — I10 Essential (primary) hypertension: Secondary | ICD-10-CM | POA: Insufficient documentation

## 2013-06-18 DIAGNOSIS — Z79899 Other long term (current) drug therapy: Secondary | ICD-10-CM | POA: Insufficient documentation

## 2013-06-18 DIAGNOSIS — E119 Type 2 diabetes mellitus without complications: Secondary | ICD-10-CM | POA: Insufficient documentation

## 2013-06-18 DIAGNOSIS — H2589 Other age-related cataract: Secondary | ICD-10-CM | POA: Insufficient documentation

## 2013-06-18 DIAGNOSIS — Z01812 Encounter for preprocedural laboratory examination: Secondary | ICD-10-CM | POA: Insufficient documentation

## 2013-06-18 HISTORY — PX: CATARACT EXTRACTION W/PHACO: SHX586

## 2013-06-18 LAB — GLUCOSE, CAPILLARY: GLUCOSE-CAPILLARY: 104 mg/dL — AB (ref 70–99)

## 2013-06-18 SURGERY — PHACOEMULSIFICATION, CATARACT, WITH IOL INSERTION
Anesthesia: Monitor Anesthesia Care | Site: Eye | Laterality: Left

## 2013-06-18 MED ORDER — LIDOCAINE 3.5 % OP GEL OPTIME - NO CHARGE
OPHTHALMIC | Status: DC | PRN
  Administered 2013-06-18: 1 [drp] via OPHTHALMIC

## 2013-06-18 MED ORDER — LIDOCAINE HCL 3.5 % OP GEL
1.0000 "application " | Freq: Once | OPHTHALMIC | Status: AC
  Administered 2013-06-18: 1 via OPHTHALMIC

## 2013-06-18 MED ORDER — BSS IO SOLN
INTRAOCULAR | Status: DC | PRN
  Administered 2013-06-18: 15 mL via INTRAOCULAR

## 2013-06-18 MED ORDER — LACTATED RINGERS IV SOLN
INTRAVENOUS | Status: DC
  Administered 2013-06-18: 1000 mL via INTRAVENOUS

## 2013-06-18 MED ORDER — FENTANYL CITRATE 0.05 MG/ML IJ SOLN
INTRAMUSCULAR | Status: AC
  Filled 2013-06-18: qty 2

## 2013-06-18 MED ORDER — FENTANYL CITRATE 0.05 MG/ML IJ SOLN
25.0000 ug | INTRAMUSCULAR | Status: AC
  Administered 2013-06-18 (×2): 25 ug via INTRAVENOUS

## 2013-06-18 MED ORDER — LIDOCAINE HCL (PF) 1 % IJ SOLN
INTRAOCULAR | Status: DC | PRN
  Administered 2013-06-18: 09:00:00 via OPHTHALMIC

## 2013-06-18 MED ORDER — NEOMYCIN-POLYMYXIN-DEXAMETH 3.5-10000-0.1 OP SUSP
OPHTHALMIC | Status: DC | PRN
  Administered 2013-06-18: 2 [drp] via OPHTHALMIC

## 2013-06-18 MED ORDER — MIDAZOLAM HCL 2 MG/2ML IJ SOLN
1.0000 mg | INTRAMUSCULAR | Status: DC | PRN
  Administered 2013-06-18: 2 mg via INTRAVENOUS

## 2013-06-18 MED ORDER — EPINEPHRINE HCL 1 MG/ML IJ SOLN
INTRAMUSCULAR | Status: AC
  Filled 2013-06-18: qty 1

## 2013-06-18 MED ORDER — TETRACAINE HCL 0.5 % OP SOLN
1.0000 [drp] | OPHTHALMIC | Status: AC
  Administered 2013-06-18 (×3): 1 [drp] via OPHTHALMIC

## 2013-06-18 MED ORDER — EPINEPHRINE HCL 1 MG/ML IJ SOLN
INTRAMUSCULAR | Status: DC | PRN
  Administered 2013-06-18: 09:00:00

## 2013-06-18 MED ORDER — POVIDONE-IODINE 5 % OP SOLN
OPHTHALMIC | Status: DC | PRN
  Administered 2013-06-18: 1 via OPHTHALMIC

## 2013-06-18 MED ORDER — PHENYLEPHRINE HCL 2.5 % OP SOLN
1.0000 [drp] | OPHTHALMIC | Status: AC
  Administered 2013-06-18 (×3): 1 [drp] via OPHTHALMIC

## 2013-06-18 MED ORDER — ONDANSETRON HCL 4 MG/2ML IJ SOLN: 4.0000 mg | Freq: Once | INTRAMUSCULAR | Status: DC | PRN

## 2013-06-18 MED ORDER — PROVISC 10 MG/ML IO SOLN
INTRAOCULAR | Status: DC | PRN
  Administered 2013-06-18: 0.85 mL via INTRAOCULAR

## 2013-06-18 MED ORDER — FENTANYL CITRATE 0.05 MG/ML IJ SOLN: 25.0000 ug | INTRAMUSCULAR | Status: DC | PRN

## 2013-06-18 MED ORDER — CYCLOPENTOLATE-PHENYLEPHRINE 0.2-1 % OP SOLN
1.0000 [drp] | OPHTHALMIC | Status: AC
  Administered 2013-06-18 (×3): 1 [drp] via OPHTHALMIC

## 2013-06-18 MED ORDER — MIDAZOLAM HCL 2 MG/2ML IJ SOLN
INTRAMUSCULAR | Status: AC
  Filled 2013-06-18: qty 2

## 2013-06-18 SURGICAL SUPPLY — 33 items
CAPSULAR TENSION RING-AMO (OPHTHALMIC RELATED) IMPLANT
CLOTH BEACON ORANGE TIMEOUT ST (SAFETY) ×3 IMPLANT
EYE SHIELD UNIVERSAL CLEAR (GAUZE/BANDAGES/DRESSINGS) ×3 IMPLANT
GLOVE BIO SURGEON STRL SZ 6.5 (GLOVE) IMPLANT
GLOVE BIO SURGEONS STRL SZ 6.5 (GLOVE)
GLOVE BIOGEL PI IND STRL 6.5 (GLOVE) IMPLANT
GLOVE BIOGEL PI IND STRL 7.0 (GLOVE) ×1 IMPLANT
GLOVE BIOGEL PI IND STRL 7.5 (GLOVE) IMPLANT
GLOVE BIOGEL PI INDICATOR 6.5 (GLOVE)
GLOVE BIOGEL PI INDICATOR 7.0 (GLOVE) ×2
GLOVE BIOGEL PI INDICATOR 7.5 (GLOVE)
GLOVE ECLIPSE 6.5 STRL STRAW (GLOVE) IMPLANT
GLOVE ECLIPSE 7.0 STRL STRAW (GLOVE) IMPLANT
GLOVE ECLIPSE 7.5 STRL STRAW (GLOVE) IMPLANT
GLOVE EXAM NITRILE LRG STRL (GLOVE) IMPLANT
GLOVE EXAM NITRILE MD LF STRL (GLOVE) ×3 IMPLANT
GLOVE SKINSENSE NS SZ6.5 (GLOVE)
GLOVE SKINSENSE NS SZ7.0 (GLOVE)
GLOVE SKINSENSE STRL SZ6.5 (GLOVE) IMPLANT
GLOVE SKINSENSE STRL SZ7.0 (GLOVE) IMPLANT
KIT VITRECTOMY (OPHTHALMIC RELATED) IMPLANT
PAD ARMBOARD 7.5X6 YLW CONV (MISCELLANEOUS) ×3 IMPLANT
PROC W NO LENS (INTRAOCULAR LENS)
PROC W SPEC LENS (INTRAOCULAR LENS)
PROCESS W NO LENS (INTRAOCULAR LENS) IMPLANT
PROCESS W SPEC LENS (INTRAOCULAR LENS) IMPLANT
RING MALYGIN (MISCELLANEOUS) IMPLANT
SIGHTPATH CAT PROC W REG LENS (Ophthalmic Related) ×3 IMPLANT
SYR TB 1ML LL NO SAFETY (SYRINGE) ×3 IMPLANT
TAPE SURG TRANSPORE 1 IN (GAUZE/BANDAGES/DRESSINGS) ×1 IMPLANT
TAPE SURGICAL TRANSPORE 1 IN (GAUZE/BANDAGES/DRESSINGS) ×2
VISCOELASTIC ADDITIONAL (OPHTHALMIC RELATED) IMPLANT
WATER STERILE IRR 250ML POUR (IV SOLUTION) ×3 IMPLANT

## 2013-06-18 NOTE — Anesthesia Postprocedure Evaluation (Signed)
  Anesthesia Post-op Note  Patient: Dennis Melton  Procedure(s) Performed: Procedure(s) with comments: CATARACT EXTRACTION PHACO AND INTRAOCULAR LENS PLACEMENT (IOC) (Left) - CDE:12.51  Patient Location: Short Stay  Anesthesia Type:MAC  Level of Consciousness: awake, alert , oriented and patient cooperative  Airway and Oxygen Therapy: Patient Spontanous Breathing  Post-op Pain: none  Post-op Assessment: Post-op Vital signs reviewed, Patient's Cardiovascular Status Stable, Respiratory Function Stable, Patent Airway and Pain level controlled  Post-op Vital Signs: Reviewed and stable  Complications: No apparent anesthesia complications

## 2013-06-18 NOTE — Transfer of Care (Signed)
Immediate Anesthesia Transfer of Care Note  Patient: Dennis Melton  Procedure(s) Performed: Procedure(s) with comments: CATARACT EXTRACTION PHACO AND INTRAOCULAR LENS PLACEMENT (IOC) (Left) - CDE:12.51  Patient Location: Short Stay  Anesthesia Type:MAC  Level of Consciousness: awake, alert , oriented and patient cooperative  Airway & Oxygen Therapy: Patient Spontanous Breathing  Post-op Assessment: Report given to PACU RN and Post -op Vital signs reviewed and stable  Post vital signs: Reviewed and stable  Complications: No apparent anesthesia complications

## 2013-06-18 NOTE — Discharge Instructions (Signed)
General Anesthesia, Adult, Care After °Refer to this sheet in the next few weeks. These instructions provide you with information on caring for yourself after your procedure. Your health care provider may also give you more specific instructions. Your treatment has been planned according to current medical practices, but problems sometimes occur. Call your health care provider if you have any problems or questions after your procedure. °WHAT TO EXPECT AFTER THE PROCEDURE °After the procedure, it is typical to experience: °· Sleepiness. °· Nausea and vomiting. °HOME CARE INSTRUCTIONS °· For the first 24 hours after general anesthesia: °· Have a responsible person with you. °· Do not drive a car. If you are alone, do not take public transportation. °· Do not drink alcohol. °· Do not take medicine that has not been prescribed by your health care provider. °· Do not sign important papers or make important decisions. °· You may resume a normal diet and activities as directed by your health care provider. °· Change bandages (dressings) as directed. °· If you have questions or problems that seem related to general anesthesia, call the hospital and ask for the anesthetist or anesthesiologist on call. °SEEK MEDICAL CARE IF: °· You have nausea and vomiting that continue the day after anesthesia. °· You develop a rash. °SEEK IMMEDIATE MEDICAL CARE IF:  °· You have difficulty breathing. °· You have chest pain. °· You have any allergic problems. °Document Released: 08/06/2000 Document Revised: 12/31/2012 Document Reviewed: 11/13/2012 °ExitCare® Patient Information ©2014 [employer]. ° ° ° °

## 2013-06-18 NOTE — H&P (Signed)
I have reviewed the H&P, the patient was re-examined, and I have identified no interval changes in medical condition and plan of care since the history and physical of record  

## 2013-06-18 NOTE — Op Note (Signed)
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  Assistants: None  Anesthesia: Topical with MAC  Indications: Painless, progressive loss of vision with compromise of daily activities.  Surgery: Cataract Extraction with Intraocular lens Implant Left Eye  Discription: The patient had dilating drops and viscous lidocaine placed into the left eye in the pre-op holding area. After transfer to the operating room, a time out was performed. The patient was then prepped and draped. Beginning with a 55 degree blade a paracentesis port was made at the surgeon's 2 o'clock position. The anterior chamber was then filled with 1% non-preserved lidocaine. This was followed by filling the anterior chamber with Provisc. A 2.19mm keratome blade was used to make a clear corneal incision at the temporal limbus. A bent cystatome needle was used to create a continuous tear capsulotomy. Hydrodissection was performed with balanced salt solution on a Fine canula. The lens nucleus was then removed using the phacoemulsification handpiece. Residual cortex was removed with the I&A handpiece. The anterior chamber and capsular bag were refilled with Provisc. A posterior chamber intraocular lens was placed into the capsular bag with it's injector. The implant was positioned with the Kuglan hook. The Provisc was then removed from the anterior chamber and capsular bag with the I&A handpiece. Stromal hydration of the main incision and paracentesis port was performed with BSS on a Fine canula. The wounds were tested for leak which was negative. The patient tolerated the procedure well. There were no operative complications. The patient was then transferred to the recovery room in stable condition.  Complications: None  Specimen: None  EBL: None  Prosthetic device: B&L enVista, MX60, power 19.5D, SN  [id number].

## 2013-06-18 NOTE — Anesthesia Preprocedure Evaluation (Signed)
Anesthesia Evaluation  Patient identified by MRN, date of birth, ID band Patient awake    Reviewed: Allergy & Precautions, H&P , NPO status , Patient's Chart, lab work & pertinent test results, reviewed documented beta blocker date and time   Airway Mallampati: III TM Distance: >3 FB Neck ROM: Full    Dental  (+) Teeth Intact   Pulmonary Current Smoker,  breath sounds clear to auscultation        Cardiovascular hypertension, Pt. on medications and Pt. on home beta blockers Rhythm:Regular Rate:Normal     Neuro/Psych PSYCHIATRIC DISORDERS Anxiety Depression    GI/Hepatic   Endo/Other  diabetes, Type 2, Oral Hypoglycemic Agents  Renal/GU      Musculoskeletal   Abdominal   Peds  Hematology   Anesthesia Other Findings   Reproductive/Obstetrics                           Anesthesia Physical Anesthesia Plan  ASA: II  Anesthesia Plan: MAC   Post-op Pain Management:    Induction: Intravenous  Airway Management Planned: Nasal Cannula  Additional Equipment:   Intra-op Plan:   Post-operative Plan:   Informed Consent: I have reviewed the patients History and Physical, chart, labs and discussed the procedure including the risks, benefits and alternatives for the proposed anesthesia with the patient or authorized representative who has indicated his/her understanding and acceptance.     Plan Discussed with:   Anesthesia Plan Comments:         Anesthesia Quick Evaluation

## 2013-06-22 ENCOUNTER — Encounter (HOSPITAL_COMMUNITY): Payer: Self-pay | Admitting: Ophthalmology

## 2013-06-26 ENCOUNTER — Ambulatory Visit (INDEPENDENT_AMBULATORY_CARE_PROVIDER_SITE_OTHER): Payer: Medicare Other | Admitting: Family Medicine

## 2013-06-26 ENCOUNTER — Encounter: Payer: Self-pay | Admitting: Family Medicine

## 2013-06-26 VITALS — BP 125/66 | HR 58 | Temp 97.0°F | Ht 71.0 in | Wt 193.4 lb

## 2013-06-26 DIAGNOSIS — K5730 Diverticulosis of large intestine without perforation or abscess without bleeding: Secondary | ICD-10-CM

## 2013-06-26 DIAGNOSIS — K635 Polyp of colon: Secondary | ICD-10-CM

## 2013-06-26 DIAGNOSIS — E119 Type 2 diabetes mellitus without complications: Secondary | ICD-10-CM

## 2013-06-26 DIAGNOSIS — K5790 Diverticulosis of intestine, part unspecified, without perforation or abscess without bleeding: Secondary | ICD-10-CM

## 2013-06-26 DIAGNOSIS — E785 Hyperlipidemia, unspecified: Secondary | ICD-10-CM

## 2013-06-26 DIAGNOSIS — R011 Cardiac murmur, unspecified: Secondary | ICD-10-CM

## 2013-06-26 DIAGNOSIS — D126 Benign neoplasm of colon, unspecified: Secondary | ICD-10-CM

## 2013-06-26 DIAGNOSIS — I10 Essential (primary) hypertension: Secondary | ICD-10-CM

## 2013-06-26 LAB — POCT GLYCOSYLATED HEMOGLOBIN (HGB A1C): Hemoglobin A1C: 5.2

## 2013-06-26 NOTE — Patient Instructions (Signed)
    Dr Juanjose Mojica's Recommendations  For nutrition information, I recommend books:  1).Eat to Live by Dr Joel Fuhrman. 2).Prevent and Reverse Heart Disease by Dr Caldwell Esselstyn. 3) Dr Neal Barnard's Book:  Program to Reverse Diabetes  Exercise recommendations are:  If unable to walk, then the patient can exercise in a chair 3 times a day. By flapping arms like a bird gently and raising legs outwards to the front.  If ambulatory, the patient can go for walks for 30 minutes 3 times a week. Then increase the intensity and duration as tolerated.  Goal is to try to attain exercise frequency to 5 times a week.  If applicable: Best to perform resistance exercises (machines or weights) 2 days a week and cardio type exercises 3 days per week.  DASH Diet The DASH diet stands for "Dietary Approaches to Stop Hypertension." It is a healthy eating plan that has been shown to reduce high blood pressure (hypertension) in as little as 14 days, while also possibly providing other significant health benefits. These other health benefits include reducing the risk of breast cancer after menopause and reducing the risk of type 2 diabetes, heart disease, colon cancer, and stroke. Health benefits also include weight loss and slowing kidney failure in patients with chronic kidney disease.  DIET GUIDELINES  Limit salt (sodium). Your diet should contain less than 1500 mg of sodium daily.  Limit refined or processed carbohydrates. Your diet should include mostly whole grains. Desserts and added sugars should be used sparingly.  Include small amounts of heart-healthy fats. These types of fats include nuts, oils, and tub margarine. Limit saturated and trans fats. These fats have been shown to be harmful in the body. CHOOSING FOODS  The following food groups are based on a 2000 calorie diet. See your Registered Dietitian for individual calorie needs. Grains and Grain Products (6 to 8 servings daily)  Eat More  Often: Whole-wheat bread, brown rice, whole-grain or wheat pasta, quinoa, popcorn without added fat or salt (air popped).  Eat Less Often: White bread, white pasta, white rice, cornbread. Vegetables (4 to 5 servings daily)  Eat More Often: Fresh, frozen, and canned vegetables. Vegetables may be raw, steamed, roasted, or grilled with a minimal amount of fat.  Eat Less Often/Avoid: Creamed or fried vegetables. Vegetables in a cheese sauce. Fruit (4 to 5 servings daily)  Eat More Often: All fresh, canned (in natural juice), or frozen fruits. Dried fruits without added sugar. One hundred percent fruit juice ( cup [237 mL] daily).  Eat Less Often: Dried fruits with added sugar. Canned fruit in light or heavy syrup. Lean Meats, Fish, and Poultry (2 servings or less daily. One serving is 3 to 4 oz [85-114 g]).  Eat More Often: Ninety percent or leaner ground beef, tenderloin, sirloin. Round cuts of beef, chicken breast, turkey breast. All fish. Grill, bake, or broil your meat. Nothing should be fried.  Eat Less Often/Avoid: Fatty cuts of meat, turkey, or chicken leg, thigh, or wing. Fried cuts of meat or fish. Dairy (2 to 3 servings)  Eat More Often: Low-fat or fat-free milk, low-fat plain or light yogurt, reduced-fat or part-skim cheese.  Eat Less Often/Avoid: Milk (whole, 2%).Whole milk yogurt. Full-fat cheeses. Nuts, Seeds, and Legumes (4 to 5 servings per week)  Eat More Often: All without added salt.  Eat Less Often/Avoid: Salted nuts and seeds, canned beans with added salt. Fats and Sweets (limited)  Eat More Often: Vegetable oils, tub margarines   without trans fats, sugar-free gelatin. Mayonnaise and salad dressings.  Eat Less Often/Avoid: Coconut oils, palm oils, butter, stick margarine, cream, half and half, cookies, candy, pie. FOR MORE INFORMATION The Dash Diet Eating Plan: www.dashdiet.org Document Released: 04/19/2011 Document Revised: 07/23/2011 Document Reviewed:  04/19/2011 ExitCare Patient Information 2014 [employer].  

## 2013-06-26 NOTE — Progress Notes (Signed)
Patient ID: Dennis Melton, male   DOB: 05-25-1940, 73 y.o.   MRN: [id number] SUBJECTIVE: CC: Chief Complaint  Patient presents with  . Follow-up    3 month follow up  no complaints    HPI: Patient is here for follow up of Diabetes Mellitus/HTN/Hypercholesterolemia: Symptoms evaluated: Denies Nocturia ,Denies Urinary Frequency , denies Blurred vision ,deniesDizziness,denies.Dysuria,denies paresthesias, denies extremity pain or ulcers.Marland Kitchendenies chest pain. has had an annual eye exam. do check the feet. Does check CBGs. Average CBG:98 Denies episodes of hypoglycemia. Does have an emergency hypoglycemic plan. admits toCompliance with medications. Denies Problems with medications.   Doing great no complaints.  Has an occasional cigar.  Past Medical History  Diagnosis Date  . Hypertension   . Diabetes mellitus   . High cholesterol   . Anxiety   . Depression    Past Surgical History  Procedure Laterality Date  . Back surgery      lumbar laminectomy  . Cataract extraction w/phaco Left 06/18/2013    Procedure: CATARACT EXTRACTION PHACO AND INTRAOCULAR LENS PLACEMENT (IOC);  Surgeon: Tonny Branch, MD;  Location: [facility];  Service: Ophthalmology;  Laterality: Left;  CDE:12.51   History   Social History  . Marital Status: Divorced    Spouse Name: N/A    Number of Children: N/A  . Years of Education: N/A   Occupational History  . Not on file.   Social History Main Topics  . Smoking status: Current Some Day Smoker -- 30 years    Types: Cigars  . Smokeless tobacco: Not on file  . Alcohol Use: Yes     Comment: occassionally  . Drug Use: No  . Sexual Activity: Yes    Birth Control/ Protection: None   Other Topics Concern  . Not on file   Social History Narrative  . No narrative on file   Family History  Problem Relation Age of Onset  . Diabetes Father   . Anxiety disorder Brother   . Heart disease Brother    Current Outpatient Prescriptions on File Prior to Visit   Medication Sig Dispense Refill  . ALPRAZolam (XANAX) 0.25 MG tablet Take 1 tablet (0.25 mg total) by mouth 3 (three) times daily.  90 tablet  1  . amLODipine (NORVASC) 10 MG tablet TAKE ONE TABLET BY MOUTH AT BEDTIME  30 tablet  3  . aspirin EC 81 MG tablet Take 81 mg by mouth daily.      Marland Kitchen atenolol (TENORMIN) 25 MG tablet TAKE  ONE TABLET BY MOUTH EVERY MORNING  30 tablet  3  . citalopram (CELEXA) 20 MG tablet TAKE ONE TABLET BY MOUTH ONE TIME DAILY  30 tablet  2  . dutasteride (AVODART) 0.5 MG capsule Take 0.5 mg by mouth daily.      . fenofibrate 54 MG tablet TAKE ONE TABLET BY MOUTH ONE TIME DAILY  30 tablet  3  . SMART SENSE THIN LANCETS 26G MISC       . Tamsulosin HCl (FLOMAX) 0.4 MG CAPS Take by mouth daily.        . valsartan-hydrochlorothiazide (DIOVAN-HCT) 160-12.5 MG per tablet TAKE ONE TABLET BY MOUTH ONE  TIME DAILY  30 tablet  4  . FREESTYLE LITE test strip 1 each daily.       . Omega-3 Fatty Acids (FISH OIL) 1200 MG CAPS Take 1 capsule by mouth 2 (two) times daily.       No current facility-administered medications on file prior to visit.   No  Known Allergies Immunization History  Administered Date(s) Administered  . Influenza,inj,Quad PF,36+ Mos 03/20/2013  . Pneumococcal Polysaccharide-23 01/12/2010  . Zoster 11/20/2010   Prior to Admission medications   Medication Sig Start Date End Date Taking? Authorizing Provider  ALPRAZolam (XANAX) 0.25 MG tablet Take 1 tablet (0.25 mg total) by mouth 3 (three) times daily. 05/12/13   Vernie Shanks, MD  amLODipine (NORVASC) 10 MG tablet TAKE ONE TABLET BY MOUTH AT BEDTIME 03/04/13   Chipper Herb, MD  aspirin EC 81 MG tablet Take 81 mg by mouth daily.    Historical Provider, MD  atenolol (TENORMIN) 25 MG tablet TAKE  ONE TABLET BY MOUTH EVERY MORNING 03/04/13   Chipper Herb, MD  citalopram (CELEXA) 20 MG tablet TAKE ONE TABLET BY MOUTH ONE TIME DAILY 04/01/13   Vernie Shanks, MD  dutasteride (AVODART) 0.5 MG capsule Take 0.5  mg by mouth daily.    Historical Provider, MD  fenofibrate 54 MG tablet TAKE ONE TABLET BY MOUTH ONE TIME DAILY 03/04/13   Chipper Herb, MD  FREESTYLE LITE test strip 1 each daily.  08/07/12   Historical Provider, MD  Omega-3 Fatty Acids (FISH OIL) 1200 MG CAPS Take 1 capsule by mouth 2 (two) times daily.    Historical Provider, MD  SMART SENSE THIN LANCETS 26G [place]  09/06/12   Historical Provider, MD  Tamsulosin HCl (FLOMAX) 0.4 MG CAPS Take by mouth daily.      Historical Provider, MD  valsartan-hydrochlorothiazide (DIOVAN-HCT) 160-12.5 MG per tablet TAKE ONE TABLET BY MOUTH ONE  TIME DAILY 01/31/13   Chipper Herb, MD     ROS: As above in the HPI. All other systems are stable or negative.  OBJECTIVE: APPEARANCE:  Patient in no acute distress.The patient appeared well nourished and normally developed. Acyanotic. Waist: VITAL SIGNS:BP 125/66  Pulse 58  Temp(Src) 97 F (36.1 C) (Oral)  Ht 5' 11" (1.803 m)  Wt 193 lb 6.4 oz (87.726 kg)  BMI 26.99 kg/m2 WM  SKIN: warm and  Dry without overt rashes, tattoos and scars  HEAD and Neck: without JVD, Head and scalp: normal Eyes:No scleral icterus. Fundi normal, eye movements normal. Ears: Auricle normal, canal normal, Tympanic membranes normal, insufflation normal. Nose: normal Throat: normal Neck & thyroid: normal  CHEST & LUNGS: Chest wall: normal Lungs: Clear  CVS: Reveals the PMI to be normally located. Regular rhythm, First and Second Heart sounds are normal,  absence of murmurs, rubs or gallops. Peripheral vasculature: Radial pulses: normal Dorsal pedis pulses: normal Posterior pulses: normal  ABDOMEN:  Appearance: normal Benign, no organomegaly, no masses, no Abdominal Aortic enlargement. No Guarding , no rebound. No Bruits. Bowel sounds: normal  RECTAL: N/A GU: N/A  EXTREMETIES: nonedematous.  MUSCULOSKELETAL:  Spine: normal Joints: intact  NEUROLOGIC: oriented to time,place and person;  nonfocal. Strength is normal Sensory is normal Reflexes are normal Cranial Nerves are normal. Results for orders placed during the hospital encounter of 06/18/13  GLUCOSE, CAPILLARY      Result Value Ref Range   Glucose-Capillary 104 (*) 70 - 99 mg/dL    ASSESSMENT:  HTN (hypertension) - Plan: CMP14+EGFR  HLD (hyperlipidemia) - Plan: CMP14+EGFR, Lipid panel  DM (diabetes mellitus) - Plan: POCT glycosylated hemoglobin (Hb A1C), CMP14+EGFR  Heart murmur, systolic  Diverticulosis  Colon polyposis Occasional cigar smoking.   PLAN: Discussed risks with cigar smoking. Need to totally stop. Benefits of plant based  Diet.       Dr Dub Mikes  Jaydin Boniface's Recommendations  For nutrition information, I recommend books:  1).Eat to Live by Dr Excell Seltzer. 2).Prevent and Reverse Heart Disease by Dr Karl Luke. 3) Dr Janene Harvey Book:  Program to Reverse Diabetes  Exercise recommendations are:  If unable to walk, then the patient can exercise in a chair 3 times a day. By flapping arms like a bird gently and raising legs outwards to the front.  If ambulatory, the patient can go for walks for 30 minutes 3 times a week. Then increase the intensity and duration as tolerated.  Goal is to try to attain exercise frequency to 5 times a week.  If applicable: Best to perform resistance exercises (machines or weights) 2 days a week and cardio type exercises 3 days per week.  Orders Placed This Encounter  Procedures  . CMP14+EGFR  . Lipid panel  . POCT glycosylated hemoglobin (Hb A1C)  same medications.  Meds ordered this encounter  Medications  . BESIVANCE 0.6 % SUSP    Sig:   . Blood Glucose Monitoring Suppl (ONE TOUCH ULTRA 2) W/DEVICE KIT    Sig:   . PROLENSA 0.07 % SOLN    Sig:   . DUREZOL 0.05 % EMUL    Sig:    There are no discontinued medications. Return in about 3 months (around 09/23/2013) for Recheck medical problems.  Macon Lesesne P. Jacelyn Grip, M.D.

## 2013-06-27 LAB — CMP14+EGFR
ALT: 8 IU/L (ref 0–44)
AST: 17 IU/L (ref 0–40)
Albumin/Globulin Ratio: 1.7 (ref 1.1–2.5)
Albumin: 4.7 g/dL (ref 3.5–4.8)
Alkaline Phosphatase: 71 IU/L (ref 39–117)
BUN/Creatinine Ratio: 15 (ref 10–22)
BUN: 17 mg/dL (ref 8–27)
CO2: 23 mmol/L (ref 18–29)
Calcium: 9.3 mg/dL (ref 8.6–10.2)
Chloride: 99 mmol/L (ref 97–108)
Creatinine, Ser: 1.16 mg/dL (ref 0.76–1.27)
GFR calc Af Amer: 73 mL/min/{1.73_m2} (ref >59)
GFR calc non Af Amer: 63 mL/min/{1.73_m2} (ref >59)
Globulin, Total: 2.8 g/dL (ref 1.5–4.5)
Glucose: 116 mg/dL — ABNORMAL HIGH (ref 65–99)
Potassium: 4.5 mmol/L (ref 3.5–5.2)
Sodium: 140 mmol/L (ref 134–144)
Total Bilirubin: 0.5 mg/dL (ref 0.0–1.2)
Total Protein: 7.5 g/dL (ref 6.0–8.5)

## 2013-06-27 LAB — LIPID PANEL
Chol/HDL Ratio: 3.5 ratio units (ref 0.0–5.0)
Cholesterol, Total: 142 mg/dL (ref 100–199)
HDL: 41 mg/dL (ref >39)
LDL Calculated: 92 mg/dL (ref 0–99)
Triglycerides: 43 mg/dL (ref 0–149)
VLDL Cholesterol Cal: 9 mg/dL (ref 5–40)

## 2013-06-30 ENCOUNTER — Telehealth: Payer: Self-pay | Admitting: *Deleted

## 2013-06-30 ENCOUNTER — Other Ambulatory Visit: Payer: Self-pay | Admitting: Family Medicine

## 2013-06-30 NOTE — Telephone Encounter (Signed)
Pt notified of lab results Verbalizes understanding 

## 2013-06-30 NOTE — Telephone Encounter (Signed)
Message copied by Marin Olp on Tue Jun 30, 2013  4:00 PM ------      Message from: Vernie Shanks      Created: Sun Jun 28, 2013  9:21 AM       Call Patient      Lab result at or close to goal.      No change in Medications for now.      No Change in recommendations.      No change in plans for follow up. ------

## 2013-07-13 ENCOUNTER — Other Ambulatory Visit: Payer: Self-pay | Admitting: *Deleted

## 2013-07-13 MED ORDER — ALPRAZOLAM 0.25 MG PO TABS: 0.2500 mg | ORAL_TABLET | Freq: Three times a day (TID) | ORAL | Status: DC

## 2013-07-13 NOTE — Telephone Encounter (Signed)
Rx ready for nurse to Phone in. 

## 2013-07-13 NOTE — Telephone Encounter (Signed)
Last refill 06/15/13 for #90. Route to pool to have nurse call in if approved. Last ov 2/15 with Dr. Jacelyn Grip.

## 2013-07-13 NOTE — Telephone Encounter (Signed)
Called in per wong

## 2013-08-10 ENCOUNTER — Other Ambulatory Visit: Payer: Self-pay | Admitting: Family Medicine

## 2013-08-10 NOTE — Telephone Encounter (Signed)
rx alprazolam called to [facility]

## 2013-08-10 NOTE — Telephone Encounter (Signed)
Rx ready for nurse to Phone in. 

## 2013-08-10 NOTE — Telephone Encounter (Signed)
Patient last seen in office on 06-26-12. Rx last filled on 07-13-13 for #90. Please advise. If approved please route to Pool A so nurse can call in to pharmacy

## 2013-09-06 ENCOUNTER — Other Ambulatory Visit: Payer: Self-pay | Admitting: Family Medicine

## 2013-09-07 NOTE — Telephone Encounter (Signed)
Last seen 06/26/13 FPW  Last filled 08/10/13   If approved route to nurse to phone into [facility]

## 2013-09-07 NOTE — Telephone Encounter (Signed)
Rx ready for nurse to Phone in. 

## 2013-09-08 NOTE — Telephone Encounter (Signed)
Phoned into [facility]

## 2013-09-26 ENCOUNTER — Other Ambulatory Visit: Payer: Self-pay | Admitting: Family Medicine

## 2013-10-12 ENCOUNTER — Other Ambulatory Visit: Payer: Self-pay

## 2013-10-12 MED ORDER — ALPRAZOLAM 0.25 MG PO TABS: ORAL_TABLET | ORAL | Status: DC

## 2013-10-12 NOTE — Telephone Encounter (Signed)
Last seen 06/16/13 FPW  IF approved route to nurse to call into [facility]

## 2013-10-12 NOTE — Telephone Encounter (Signed)
Called into [facility] 

## 2013-10-12 NOTE — Telephone Encounter (Signed)
This is okay to refill 

## 2013-10-12 NOTE — Telephone Encounter (Signed)
Last seen 06/16/13 FPW  IF approved route to nurse to call into [facility] 

## 2013-10-23 ENCOUNTER — Ambulatory Visit: Payer: Medicare Other | Admitting: Family

## 2013-10-27 ENCOUNTER — Other Ambulatory Visit: Payer: Self-pay | Admitting: Family Medicine

## 2013-11-03 ENCOUNTER — Ambulatory Visit: Payer: Medicare Other | Admitting: Family

## 2013-11-03 ENCOUNTER — Encounter: Payer: Self-pay | Admitting: Family

## 2013-11-03 ENCOUNTER — Ambulatory Visit (INDEPENDENT_AMBULATORY_CARE_PROVIDER_SITE_OTHER): Payer: Medicare Other | Admitting: Family

## 2013-11-03 VITALS — BP 110/60 | HR 52 | Temp 97.4°F | Ht 71.0 in | Wt 192.0 lb

## 2013-11-03 DIAGNOSIS — Z13228 Encounter for screening for other metabolic disorders: Secondary | ICD-10-CM

## 2013-11-03 DIAGNOSIS — I10 Essential (primary) hypertension: Secondary | ICD-10-CM

## 2013-11-03 DIAGNOSIS — F411 Generalized anxiety disorder: Secondary | ICD-10-CM

## 2013-11-03 DIAGNOSIS — Z130 Encounter for screening for diseases of the blood and blood-forming organs and certain disorders involving the immune mechanism: Secondary | ICD-10-CM

## 2013-11-03 DIAGNOSIS — Z1321 Encounter for screening for nutritional disorder: Secondary | ICD-10-CM

## 2013-11-03 DIAGNOSIS — Z1329 Encounter for screening for other suspected endocrine disorder: Secondary | ICD-10-CM

## 2013-11-03 DIAGNOSIS — E785 Hyperlipidemia, unspecified: Secondary | ICD-10-CM

## 2013-11-03 DIAGNOSIS — Z23 Encounter for immunization: Secondary | ICD-10-CM

## 2013-11-03 DIAGNOSIS — E119 Type 2 diabetes mellitus without complications: Secondary | ICD-10-CM

## 2013-11-03 LAB — POCT GLYCOSYLATED HEMOGLOBIN (HGB A1C): HEMOGLOBIN A1C: 5.1

## 2013-11-03 MED ORDER — CITALOPRAM HYDROBROMIDE 20 MG PO TABS: ORAL_TABLET | ORAL | Status: DC

## 2013-11-03 MED ORDER — ALPRAZOLAM 0.25 MG PO TABS: ORAL_TABLET | ORAL | Status: DC

## 2013-11-03 NOTE — Patient Instructions (Signed)

## 2013-11-03 NOTE — Addendum Note (Signed)
Addended by: Ilean China on: 11/03/2013 09:01 AM   Modules accepted: Orders

## 2013-11-03 NOTE — Progress Notes (Signed)
Subjective:    Patient ID: ALOYSIUS Melton, male    DOB: February 09, 1941, 73 y.o.   MRN: [id number]  Hypertension This is a chronic problem. The current episode started more than 1 year ago. The problem has been resolved since onset. The problem is controlled. Associated symptoms include anxiety. Pertinent negatives include no blurred vision, headaches, palpitations, peripheral edema or shortness of breath. Risk factors for coronary artery disease include diabetes mellitus, dyslipidemia, male gender, smoking/tobacco exposure and family history. Past treatments include beta blockers, diuretics, calcium channel blockers and angiotensin blockers. The current treatment provides significant improvement. There is no history of kidney disease, CAD/MI or a thyroid problem. There is no history of sleep apnea.  Hyperlipidemia This is a chronic problem. The current episode started more than 1 year ago. The problem is controlled. Recent lipid tests were reviewed and are normal. Exacerbating diseases include diabetes. He has no history of hypothyroidism. Factors aggravating his hyperlipidemia include smoking. Pertinent negatives include no leg pain, myalgias or shortness of breath. Current antihyperlipidemic treatment includes fibric acid derivatives. The current treatment provides mild improvement of lipids. Risk factors for coronary artery disease include diabetes mellitus, dyslipidemia, family history, hypertension and male sex.  Anxiety Presents for follow-up visit. Symptoms include depressed mood. Patient reports no dizziness, dry mouth, excessive worry, insomnia, irritability, nervous/anxious behavior, palpitations or shortness of breath. Symptoms occur rarely. The quality of sleep is good.   His past medical history is significant for depression. There is no history of CAD or CHF. Past treatments include SSRIs and benzodiazephines. The treatment provided significant relief. Compliance with prior treatments has been  good.    *PT states she was a diabetic and had taken metformin, but was stopped over 1 half ago due to blood sugar control.  Review of Systems  Constitutional: Negative for irritability.  HENT: Negative.   Eyes: Negative for blurred vision.  Respiratory: Negative.  Negative for shortness of breath.   Cardiovascular: Negative.  Negative for palpitations.  Gastrointestinal: Negative.   Genitourinary: Negative.   Musculoskeletal: Negative.  Negative for myalgias.  Neurological: Negative.  Negative for dizziness and headaches.  Hematological: Negative.   Psychiatric/Behavioral: The patient is not nervous/anxious and does not have insomnia.   All other systems reviewed and are negative.      Objective:   Physical Exam  Vitals reviewed. Constitutional: He is oriented to person, place, and time. He appears well-developed and well-nourished. No distress.  HENT:  Head: Normocephalic.  Right Ear: External ear normal.  Left Ear: External ear normal.  Mouth/Throat: Oropharynx is clear and moist.  Eyes: Pupils are equal, round, and reactive to light. Right eye exhibits no discharge. Left eye exhibits no discharge.  Neck: Normal range of motion. Neck supple. No thyromegaly present.  Cardiovascular: Normal rate, regular rhythm and intact distal pulses.   Murmur heard. Pulmonary/Chest: Effort normal and breath sounds normal. No respiratory distress. He has no wheezes.  Abdominal: Soft. Bowel sounds are normal. He exhibits no distension. There is no tenderness.  Musculoskeletal: Normal range of motion. He exhibits no edema and no tenderness.  Neurological: He is alert and oriented to person, place, and time. He has normal reflexes. No cranial nerve deficit.  Skin: Skin is warm and dry. No rash noted. No erythema.  Psychiatric: He has a normal mood and affect. His behavior is normal. Judgment and thought content normal.    BP 110/60  Pulse 52  Temp(Src) 97.4 F (36.3 C) (Oral)  Ht '5\' 11"'   (  1.803 m)  Wt 192 lb (87.091 kg)  BMI 26.79 kg/m2       Assessment & Plan:  1. Essential hypertension - CMP14+EGFR  2. HLD (hyperlipidemia) - Lipid panel  3. Generalized anxiety disorder - ALPRAZolam (XANAX) 0.25 MG tablet; TAKE ONE TABLET BY MOUTH THREE TIMES DAILY AS NEEDED  Dispense: 90 tablet; Refill: 0 - citalopram (CELEXA) 20 MG tablet; TAKE ONE TABLET BY MOUTH ONE TIME DAILY  Dispense: 30 tablet; Refill: 11  4. Type 2 diabetes mellitus without complication - POCT glycosylated hemoglobin (Hb A1C)  5. Encounter for vitamin deficiency screening - Vit D  25 hydroxy (rtn osteoporosis monitoring)   Continue all meds Labs pending Health Maintenance reviewed-Tetanus/TDAP given today Diet and exercise encouraged RTO 3 months  Dennis Dun, FNP

## 2013-11-04 LAB — CMP14+EGFR
A/G RATIO: 1.8 (ref 1.1–2.5)
ALBUMIN: 4.4 g/dL (ref 3.5–4.8)
ALT: 14 IU/L (ref 0–44)
AST: 15 IU/L (ref 0–40)
Alkaline Phosphatase: 50 IU/L (ref 39–117)
BUN/Creatinine Ratio: 19 (ref 10–22)
BUN: 23 mg/dL (ref 8–27)
CALCIUM: 9.1 mg/dL (ref 8.6–10.2)
CO2: 24 mmol/L (ref 18–29)
Chloride: 99 mmol/L (ref 97–108)
Creatinine, Ser: 1.22 mg/dL (ref 0.76–1.27)
GFR calc Af Amer: 68 mL/min/{1.73_m2} (ref >59)
GFR calc non Af Amer: 59 mL/min/{1.73_m2} — ABNORMAL LOW (ref >59)
Globulin, Total: 2.4 g/dL (ref 1.5–4.5)
Glucose: 104 mg/dL — ABNORMAL HIGH (ref 65–99)
Potassium: 4.5 mmol/L (ref 3.5–5.2)
Sodium: 143 mmol/L (ref 134–144)
TOTAL PROTEIN: 6.8 g/dL (ref 6.0–8.5)
Total Bilirubin: 0.4 mg/dL (ref 0.0–1.2)

## 2013-11-04 LAB — LIPID PANEL
CHOL/HDL RATIO: 5.7 ratio — AB (ref 0.0–5.0)
Cholesterol, Total: 177 mg/dL (ref 100–199)
HDL: 31 mg/dL — AB (ref >39)
LDL CALC: 127 mg/dL — AB (ref 0–99)
TRIGLYCERIDES: 94 mg/dL (ref 0–149)
VLDL CHOLESTEROL CAL: 19 mg/dL (ref 5–40)

## 2013-11-04 LAB — VITAMIN D 25 HYDROXY (VIT D DEFICIENCY, FRACTURES): VIT D 25 HYDROXY: 35.9 ng/mL (ref 30.0–100.0)

## 2013-11-10 ENCOUNTER — Other Ambulatory Visit: Payer: Self-pay | Admitting: Family Medicine

## 2013-11-11 NOTE — Telephone Encounter (Signed)
RX called in .

## 2013-11-11 NOTE — Telephone Encounter (Signed)
Patient last seen in office on 11-03-13. Rx last filled on 10-12-13 for #90. Please advise. If approved please route to pool A so nurse can phone in to pharmacy

## 2013-11-29 ENCOUNTER — Other Ambulatory Visit: Payer: Self-pay | Admitting: Family

## 2013-12-08 ENCOUNTER — Other Ambulatory Visit: Payer: Self-pay | Admitting: Family Medicine

## 2013-12-09 NOTE — Telephone Encounter (Signed)
Called to [facility]. 

## 2013-12-09 NOTE — Telephone Encounter (Signed)
Last seen 11/03/13  Dennis Melton   If approved route to nurse to call into [facility]

## 2014-01-03 ENCOUNTER — Other Ambulatory Visit: Payer: Self-pay | Admitting: Family Medicine

## 2014-01-04 ENCOUNTER — Other Ambulatory Visit: Payer: Self-pay | Admitting: *Deleted

## 2014-01-04 MED ORDER — FENOFIBRATE 54 MG PO TABS: ORAL_TABLET | ORAL | Status: DC

## 2014-01-04 MED ORDER — VALSARTAN-HYDROCHLOROTHIAZIDE 160-12.5 MG PO TABS: ORAL_TABLET | ORAL | Status: DC

## 2014-01-04 MED ORDER — ATENOLOL 25 MG PO TABS: ORAL_TABLET | ORAL | Status: DC

## 2014-01-04 MED ORDER — AMLODIPINE BESYLATE 10 MG PO TABS: ORAL_TABLET | ORAL | Status: DC

## 2014-01-05 NOTE — Telephone Encounter (Signed)
Patient last seen in office on 11-03-13. Last refill on 12-09-13 for #90. Please advise. If approved please route to Pool A so nurse can phone in to pharmacy

## 2014-01-06 NOTE — Telephone Encounter (Signed)
Called to [facility]. 

## 2014-02-08 ENCOUNTER — Ambulatory Visit: Payer: Medicare Other | Admitting: Family

## 2014-03-02 ENCOUNTER — Other Ambulatory Visit: Payer: Self-pay | Admitting: Family Medicine

## 2014-03-04 NOTE — Telephone Encounter (Signed)
Phoned in.

## 2014-03-04 NOTE — Telephone Encounter (Signed)
This is okay to refill 

## 2014-03-04 NOTE — Telephone Encounter (Signed)
Patient of Dennis Melton. Last seen in office on 11-03-13. Rx last filled on 02-03-14 for #90. Please advise. If approved please route to pool A so nurse can phone in to pharmacy

## 2014-03-17 ENCOUNTER — Ambulatory Visit (INDEPENDENT_AMBULATORY_CARE_PROVIDER_SITE_OTHER): Payer: Medicare Other | Admitting: Family Medicine

## 2014-03-17 ENCOUNTER — Ambulatory Visit (INDEPENDENT_AMBULATORY_CARE_PROVIDER_SITE_OTHER): Payer: Medicare Other | Admitting: *Deleted

## 2014-03-17 ENCOUNTER — Encounter: Payer: Self-pay | Admitting: Family Medicine

## 2014-03-17 VITALS — BP 142/72 | HR 57 | Temp 98.0°F | Ht 71.0 in | Wt 192.4 lb

## 2014-03-17 DIAGNOSIS — F411 Generalized anxiety disorder: Secondary | ICD-10-CM

## 2014-03-17 DIAGNOSIS — E785 Hyperlipidemia, unspecified: Secondary | ICD-10-CM

## 2014-03-17 DIAGNOSIS — Z23 Encounter for immunization: Secondary | ICD-10-CM

## 2014-03-17 DIAGNOSIS — I10 Essential (primary) hypertension: Secondary | ICD-10-CM

## 2014-03-17 NOTE — Patient Instructions (Signed)

## 2014-03-17 NOTE — Addendum Note (Signed)
Addended by: Shelbie Ammons on: 03/17/2014 10:41 AM   Modules accepted: Orders

## 2014-03-17 NOTE — Progress Notes (Signed)
   Subjective:    Patient ID: Dennis Melton, male    DOB: Nov 26, 1940, 73 y.o.   MRN: [id number]  HPI 73 year old gentleman here to follow-up hypertension. Is on a multidrug regimen which controls his blood pressure well. Note that he is on atenolol 25 mg once a day. I did recommend taking this one half tablet twice a day for efficacy reasons. He is also on Avodart but states that his insurance will no longer cover this medicine and I suggested we could stop that since he already takes Flomax. He has no problems with nocturia. We are also holding fenofibrate and will recheck his lipids at next visit to see if there is any change in those values.    Review of Systems  Constitutional: Negative.   HENT: Negative.   Eyes: Negative.   Respiratory: Negative.  Negative for shortness of breath.   Cardiovascular: Negative.  Negative for chest pain and leg swelling.  Gastrointestinal: Negative.   Genitourinary: Negative.   Musculoskeletal: Negative.   Skin: Negative.   Neurological: Negative.   Psychiatric/Behavioral: Negative.   All other systems reviewed and are negative.      Objective:   Physical Exam  Constitutional: He is oriented to person, place, and time. He appears well-developed and well-nourished.  HENT:  Head: Normocephalic.  Right Ear: External ear normal.  Left Ear: External ear normal.  Nose: Nose normal.  Mouth/Throat: Oropharynx is clear and moist.  Eyes: Conjunctivae and EOM are normal. Pupils are equal, round, and reactive to light.  Neck: Normal range of motion. Neck supple.  Cardiovascular: Normal rate, regular rhythm, normal heart sounds and intact distal pulses.   Pulmonary/Chest: Effort normal and breath sounds normal.  Abdominal: Soft. Bowel sounds are normal.  Musculoskeletal: Normal range of motion.  Neurological: He is alert and oriented to person, place, and time.  Skin: Skin is warm and dry.  Psychiatric: He has a normal mood and affect. His behavior is  normal. Judgment and thought content normal.    BP 142/72 mmHg  Pulse 57  Temp(Src) 98 F (36.7 C) (Oral)  Ht 5\' 11"  (1.803 m)  Wt 192 lb 6.4 oz (87.272 kg)  BMI 26.85 kg/m2      Assessment & Plan:  1. Essential hypertension   2. HLD (hyperlipidemia) Hold fenofibrate and recheck lipids at next visit in 4 months.  3. Generalized anxiety disorder   Wardell Honour MD

## 2014-03-23 ENCOUNTER — Telehealth: Payer: Self-pay | Admitting: Family Medicine

## 2014-03-23 NOTE — Telephone Encounter (Signed)
Patient aware it is ok for him to come tomorrow for urine test

## 2014-03-24 ENCOUNTER — Other Ambulatory Visit: Payer: Medicare Other

## 2014-03-25 LAB — LIPID PANEL
Chol/HDL Ratio: 4.3 ratio units (ref 0.0–5.0)
Cholesterol, Total: 145 mg/dL (ref 100–199)
HDL: 34 mg/dL — ABNORMAL LOW (ref >39)
LDL CALC: 89 mg/dL (ref 0–99)
Triglycerides: 112 mg/dL (ref 0–149)
VLDL Cholesterol Cal: 22 mg/dL (ref 5–40)

## 2014-03-25 LAB — MICROALBUMIN, URINE: MICROALBUM., U, RANDOM: 8.7 ug/mL (ref 0.0–17.0)

## 2014-04-01 ENCOUNTER — Other Ambulatory Visit: Payer: Self-pay | Admitting: Family Medicine

## 2014-04-02 NOTE — Telephone Encounter (Signed)
Last filled 03/04/14, last seen 03/17/14. Nurse call into [facility] if approved

## 2014-04-06 ENCOUNTER — Telehealth: Payer: Self-pay | Admitting: *Deleted

## 2014-04-06 NOTE — Telephone Encounter (Signed)
RX called into [facility] for Xanax Okayed per Dr Sabra Heck

## 2014-04-29 ENCOUNTER — Other Ambulatory Visit: Payer: Self-pay | Admitting: Family

## 2014-05-02 ENCOUNTER — Other Ambulatory Visit: Payer: Self-pay | Admitting: Family Medicine

## 2014-05-03 NOTE — Telephone Encounter (Signed)
Pt requesting refill on xanax 0.25 mg, take 1 PO TID prn #90 with no refills given on 04/05/14, if ok to refill please route back to pools to have nurse call into [facility] in [place].

## 2014-05-04 ENCOUNTER — Telehealth: Payer: Self-pay | Admitting: *Deleted

## 2014-05-04 NOTE — Telephone Encounter (Signed)
Xanax called to pharmacy.

## 2014-06-01 ENCOUNTER — Encounter: Payer: Self-pay | Admitting: *Deleted

## 2014-06-02 ENCOUNTER — Other Ambulatory Visit: Payer: Self-pay | Admitting: Family Medicine

## 2014-06-02 NOTE — Telephone Encounter (Signed)
Last seen 03/17/14 Dr Sabra Heck  If approved route to nurse to call into [facility]

## 2014-06-07 ENCOUNTER — Other Ambulatory Visit: Payer: Self-pay | Admitting: Family Medicine

## 2014-06-07 NOTE — Telephone Encounter (Signed)
rx called into pharmacy

## 2014-06-08 ENCOUNTER — Telehealth: Payer: Self-pay | Admitting: Family Medicine

## 2014-07-21 ENCOUNTER — Ambulatory Visit: Payer: Medicare Other | Admitting: Family Medicine

## 2014-07-23 ENCOUNTER — Ambulatory Visit: Payer: Medicare Other | Admitting: Family Medicine

## 2014-07-23 ENCOUNTER — Telehealth: Payer: Self-pay | Admitting: Family Medicine

## 2014-07-23 NOTE — Telephone Encounter (Signed)
Appointment given for 3/23 with Sabra Heck

## 2014-08-04 ENCOUNTER — Ambulatory Visit (INDEPENDENT_AMBULATORY_CARE_PROVIDER_SITE_OTHER): Payer: Medicare Other | Admitting: Family Medicine

## 2014-08-04 ENCOUNTER — Encounter: Payer: Self-pay | Admitting: Family Medicine

## 2014-08-04 VITALS — Temp 98.0°F | Ht 71.0 in | Wt 200.0 lb

## 2014-08-04 DIAGNOSIS — E785 Hyperlipidemia, unspecified: Secondary | ICD-10-CM

## 2014-08-04 MED ORDER — ALPRAZOLAM 0.25 MG PO TABS: 0.2500 mg | ORAL_TABLET | Freq: Three times a day (TID) | ORAL | Status: DC | PRN

## 2014-08-04 NOTE — Progress Notes (Signed)
   Subjective:    Patient ID: Dennis Melton, male    DOB: 07/06/1940, 74 y.o.   MRN: [id number]  HPI 73 year old gentleman who is here to follow-up hypertension, anxiety, and hyperlipidemia. He has no complaints and feels well today. Since we stopped Avodart he has not seen any difference in his urinary symptoms. We also stopped fenofibrate at his last visit and the plan was to repeat lipids today to see if that has made any difference. He uses Xanax 2-3 times a day and that works well for him.    Review of Systems  Constitutional: Negative.   HENT: Negative.   Respiratory: Negative.   Cardiovascular: Negative.   Gastrointestinal: Negative.   Musculoskeletal: Negative.   Neurological: Negative.        Patient Active Problem List   Diagnosis Date Noted  . Generalized anxiety disorder 03/12/2013  . HTN (hypertension) 09/16/2012  . HLD (hyperlipidemia) 09/16/2012  . Heart murmur, systolic 53/61/4431  . Colon polyposis 11/25/2009  . Diverticulosis 11/25/2009   Outpatient Encounter Prescriptions as of 08/04/2014  Medication Sig  . ALPRAZolam (XANAX) 0.25 MG tablet Take 1 tablet (0.25 mg total) by mouth 3 (three) times daily as needed.  Marland Kitchen amLODipine (NORVASC) 10 MG tablet TAKE ONE TABLET BY MOUTH AT BEDTIME  . aspirin EC 81 MG tablet Take 81 mg by mouth daily.  Marland Kitchen atenolol (TENORMIN) 25 MG tablet TAKE  ONE TABLET BY MOUTH EVERY MORNING  . Blood Glucose Monitoring Suppl (ONE TOUCH ULTRA 2) W/DEVICE KIT   . citalopram (CELEXA) 20 MG tablet TAKE ONE TABLET BY MOUTH ONE TIME DAILY  . FREESTYLE LITE test strip 1 each daily.   . Omega-3 Fatty Acids (FISH OIL) 1200 MG CAPS Take 1 capsule by mouth 2 (two) times daily.  Marland Kitchen SMART SENSE THIN LANCETS 26G MISC   . Tamsulosin HCl (FLOMAX) 0.4 MG CAPS Take by mouth daily.    . valsartan-hydrochlorothiazide (DIOVAN-HCT) 160-12.5 MG per tablet TAKE ONE TABLET BY MOUTH ONE TIME DAILY  . [DISCONTINUED] ALPRAZolam (XANAX) 0.25 MG tablet TAKE ONE TABLET  BY MOUTH THREE TIMES DAILY AS NEEDED   Objective:   Physical Exam  Constitutional: He is oriented to person, place, and time. He appears well-developed and well-nourished.  Cardiovascular: Normal rate, regular rhythm and normal heart sounds.   Pulmonary/Chest: Effort normal and breath sounds normal.  Abdominal: Soft.  Musculoskeletal: Normal range of motion.  Neurological: He is alert and oriented to person, place, and time.          Assessment & Plan:  1. Hyperlipidemia Off fenofibrate but he has gained 8 pounds since last visit 4 months ago - Lipid panel  2. Hypertension Blood pressure well controlled on current regimen continue as before  3.Anxiety Does well with alprazolam  Wardell Honour MD

## 2014-08-05 LAB — LIPID PANEL
CHOL/HDL RATIO: 3.5 ratio (ref 0.0–5.0)
Cholesterol, Total: 162 mg/dL (ref 100–199)
HDL: 46 mg/dL (ref >39)
LDL Calculated: 87 mg/dL (ref 0–99)
Triglycerides: 145 mg/dL (ref 0–149)
VLDL CHOLESTEROL CAL: 29 mg/dL (ref 5–40)

## 2014-09-28 ENCOUNTER — Other Ambulatory Visit: Payer: Self-pay | Admitting: Family Medicine

## 2014-10-14 ENCOUNTER — Other Ambulatory Visit: Payer: Self-pay | Admitting: Family Medicine

## 2014-10-14 NOTE — Telephone Encounter (Signed)
Last seen 08/04/14 Dr Sabra Heck  If approved route to nurse to call into [facility]

## 2014-11-01 ENCOUNTER — Telehealth: Payer: Self-pay | Admitting: Family Medicine

## 2014-11-01 DIAGNOSIS — N400 Enlarged prostate without lower urinary tract symptoms: Secondary | ICD-10-CM

## 2014-11-01 NOTE — Telephone Encounter (Signed)
Patient is agreeable with seeing an [facility] physician in [place]- referral entered.

## 2014-11-01 NOTE — Telephone Encounter (Signed)
Doctors from [facility] come to regional. If that is satisfactory would set him up with Dr. Jeffie Pollock or [place] over there

## 2014-11-01 NOTE — Telephone Encounter (Signed)
Dr. Sabra Heck please advise if you have a preference on who he should see- wants to go somewhere besides [facility].

## 2014-11-17 ENCOUNTER — Other Ambulatory Visit: Payer: Self-pay | Admitting: Family Medicine

## 2014-11-17 NOTE — Telephone Encounter (Signed)
Last seen 08/04/14 Dr Sabra Heck  If approved route to nurse to call into [facility]

## 2014-11-19 NOTE — Telephone Encounter (Signed)
Chart reviewed. This mediation is due and Dr Sabra Heck intends on keeping him on it. Script called to [facility] and left on voicemail.

## 2014-11-29 ENCOUNTER — Other Ambulatory Visit: Payer: Self-pay | Admitting: Family

## 2014-12-16 DIAGNOSIS — N138 Other obstructive and reflux uropathy: Secondary | ICD-10-CM | POA: Diagnosis not present

## 2014-12-16 DIAGNOSIS — N401 Enlarged prostate with lower urinary tract symptoms: Secondary | ICD-10-CM | POA: Diagnosis not present

## 2014-12-16 DIAGNOSIS — N529 Male erectile dysfunction, unspecified: Secondary | ICD-10-CM | POA: Diagnosis not present

## 2014-12-20 ENCOUNTER — Other Ambulatory Visit: Payer: Self-pay | Admitting: Family Medicine

## 2014-12-20 NOTE — Telephone Encounter (Signed)
Last filled 11/19/14, last seen 08/04/14. Has appt 02/09/15. [place] pt. Route if appproved, nurse call in at [facility]

## 2014-12-21 NOTE — Telephone Encounter (Signed)
Refill called to [facility] 

## 2015-01-18 ENCOUNTER — Other Ambulatory Visit: Payer: Self-pay | Admitting: Family

## 2015-01-18 NOTE — Telephone Encounter (Signed)
Last seen 08/04/14 Dr Sabra Heck  If  Approved route to nurse to call into [facility]

## 2015-01-26 ENCOUNTER — Other Ambulatory Visit: Payer: Self-pay | Admitting: Family Medicine

## 2015-01-27 ENCOUNTER — Ambulatory Visit (INDEPENDENT_AMBULATORY_CARE_PROVIDER_SITE_OTHER): Payer: Medicare Other | Admitting: Family Medicine

## 2015-01-27 ENCOUNTER — Encounter: Payer: Self-pay | Admitting: Family Medicine

## 2015-01-27 ENCOUNTER — Ambulatory Visit (INDEPENDENT_AMBULATORY_CARE_PROVIDER_SITE_OTHER): Payer: Medicare Other

## 2015-01-27 VITALS — BP 112/57 | HR 56 | Temp 98.7°F | Ht 71.0 in | Wt 188.2 lb

## 2015-01-27 DIAGNOSIS — M25571 Pain in right ankle and joints of right foot: Secondary | ICD-10-CM | POA: Diagnosis not present

## 2015-01-27 MED ORDER — PREDNISONE 20 MG PO TABS: ORAL_TABLET | ORAL | Status: DC

## 2015-01-27 NOTE — Patient Instructions (Signed)
Great to meet you!  Try the steroids, if this gout it will help and if it is inflammation from something else it will help as well.   If you do not improve we can set you up to see an orthopedist.

## 2015-01-27 NOTE — Progress Notes (Signed)
   HPI  Patient presents today for right ankle pain  Patient explains that her last 2 weeks these have dull slightly progressively worsening right ankle pain. States it's worse with walking. On Sunday, 9/11, he had severe symptoms that can't him from getting out of bed all day long.  He states that Monday continued to hurt but then Tuesday he was improved enough for him to go out. He went to the sweepstakes place and set with his legs crossed and could not walk at the end of it.  Very active man around the house and yard. This is limiting his normal function.  He does not have a history of gout but wonders if this is gout, his friends have gout.  PMH: Smoking status noted ROS: Per HPI  Objective: BP 112/57 mmHg  Pulse 56  Temp(Src) 98.7 F (37.1 C)  Ht 5\' 11"  (1.803 m)  Wt 188 lb 3.2 oz (85.367 kg)  BMI 26.26 kg/m2 Gen: NAD, alert, cooperative with exam HEENT: NCAT Ext: No edema, warm Neuro: Alert and oriented, No gross deficits MSK: R ankle without any effusion, edema, or tenderness to palpation of any bony structures or ligamentous structures. 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 DG Ankle Complete Right    Standing Status: Future     Number of Occurrences:      Standing Expiration Date: 03/28/2016    Order Specific Question:  Reason for Exam (SYMPTOM  OR DIAGNOSIS REQUIRED)    Answer:  R ankle pain, r/o fracture eval for OA    Order Specific Question:  Preferred imaging location?    Answer:  Internal     [name], MD [facility] 01/27/2015, 2:07 PM

## 2015-02-04 ENCOUNTER — Emergency Department (HOSPITAL_COMMUNITY)
Admission: EM | Admit: 2015-02-04 | Discharge: 2015-02-04 | Disposition: A | Discharge status: Home / Self Care | Payer: Medicare Other | Attending: Emergency Medicine | Admitting: Emergency Medicine

## 2015-02-04 ENCOUNTER — Encounter (HOSPITAL_COMMUNITY): Payer: Self-pay | Admitting: *Deleted

## 2015-02-04 DIAGNOSIS — Z7982 Long term (current) use of aspirin: Secondary | ICD-10-CM | POA: Insufficient documentation

## 2015-02-04 DIAGNOSIS — Z720 Tobacco use: Secondary | ICD-10-CM | POA: Diagnosis not present

## 2015-02-04 DIAGNOSIS — F329 Major depressive disorder, single episode, unspecified: Secondary | ICD-10-CM | POA: Insufficient documentation

## 2015-02-04 DIAGNOSIS — Z79899 Other long term (current) drug therapy: Secondary | ICD-10-CM | POA: Insufficient documentation

## 2015-02-04 DIAGNOSIS — N179 Acute kidney failure, unspecified: Secondary | ICD-10-CM | POA: Insufficient documentation

## 2015-02-04 DIAGNOSIS — I739 Peripheral vascular disease, unspecified: Secondary | ICD-10-CM

## 2015-02-04 DIAGNOSIS — E119 Type 2 diabetes mellitus without complications: Secondary | ICD-10-CM | POA: Diagnosis not present

## 2015-02-04 DIAGNOSIS — F419 Anxiety disorder, unspecified: Secondary | ICD-10-CM | POA: Insufficient documentation

## 2015-02-04 DIAGNOSIS — M25571 Pain in right ankle and joints of right foot: Secondary | ICD-10-CM | POA: Diagnosis present

## 2015-02-04 DIAGNOSIS — I10 Essential (primary) hypertension: Secondary | ICD-10-CM | POA: Insufficient documentation

## 2015-02-04 DIAGNOSIS — M79604 Pain in right leg: Secondary | ICD-10-CM | POA: Diagnosis not present

## 2015-02-04 LAB — I-STAT CHEM 8, ED
BUN: 19 mg/dL (ref 6–20)
CHLORIDE: 109 mmol/L (ref 101–111)
Calcium, Ion: 0.83 mmol/L — ABNORMAL LOW (ref 1.13–1.30)
Creatinine, Ser: 1.6 mg/dL — ABNORMAL HIGH (ref 0.61–1.24)
Glucose, Bld: 93 mg/dL (ref 65–99)
HEMATOCRIT: 39 % (ref 39.0–52.0)
Hemoglobin: 13.3 g/dL (ref 13.0–17.0)
POTASSIUM: 4.2 mmol/L (ref 3.5–5.1)
SODIUM: 137 mmol/L (ref 135–145)
TCO2: 16 mmol/L (ref 0–100)

## 2015-02-04 MED ORDER — OXYCODONE-ACETAMINOPHEN 5-325 MG PO TABS: 1.0000 | ORAL_TABLET | Freq: Three times a day (TID) | ORAL | Status: DC | PRN

## 2015-02-04 MED ORDER — METRONIDAZOLE 500 MG PO TABS: 500.0000 mg | ORAL_TABLET | Freq: Once | ORAL | Status: DC

## 2015-02-04 MED ORDER — CIPROFLOXACIN HCL 250 MG PO TABS: 500.0000 mg | ORAL_TABLET | Freq: Once | ORAL | Status: DC

## 2015-02-04 NOTE — ED Notes (Signed)
MD at bedside. 

## 2015-02-04 NOTE — ED Provider Notes (Signed)
CSN: [id number]     Arrival date & time 02/04/15  1340 History   First MD Initiated Contact with Patient 02/04/15 1417     Chief Complaint  Patient presents with  . Ankle Pain     (Consider location/radiation/quality/duration/timing/severity/associated sxs/prior Treatment) Patient is a 74 y.o. male presenting with leg pain.  Leg Pain Associated symptoms: no back pain, no fever and no neck pain    74 year old male with approximately 6 weeks of worsening right lower leg pain worse with exertion and has progressively worsen during that time. Saw his primary doctor who did an x-ray with no evidence of fracture. Patient states it hurts the more he uses it. He doesn't hurt necessarily right when he stands on it and doesn't hurt every time but if he is using it for a long period of time it will hurt worse on the lateral side of that right leg and then down into his foot as well. Taking multiple BC powders that has not helped.  Past Medical History  Diagnosis Date  . Hypertension   . Diabetes mellitus   . High cholesterol   . Anxiety   . Depression    Past Surgical History  Procedure Laterality Date  . Back surgery      lumbar laminectomy  . Cataract extraction w/phaco Left 06/18/2013    Procedure: CATARACT EXTRACTION PHACO AND INTRAOCULAR LENS PLACEMENT (IOC);  Surgeon: Tonny Branch, MD;  Location: [facility];  Service: Ophthalmology;  Laterality: Left;  CDE:12.51   Family History  Problem Relation Age of Onset  . Diabetes Father   . Anxiety disorder Brother   . Heart disease Brother    Social History  Substance Use Topics  . Smoking status: Current Some Day Smoker -- 2.00 packs/day for 30 years    Types: Cigars  . Smokeless tobacco: None  . Alcohol Use: Yes     Comment: occassionally    Review of Systems  Constitutional: Negative for fever and chills.  HENT: Negative for congestion and rhinorrhea.   Eyes: Negative for visual disturbance.  Respiratory: Negative for cough and  shortness of breath.   Cardiovascular: Negative for chest pain.  Gastrointestinal: Negative for vomiting, abdominal pain, diarrhea and constipation.  Endocrine: Negative for polyuria.  Genitourinary: Negative for dysuria and flank pain.  Musculoskeletal: Negative for back pain and neck pain.       Right leg pain  Skin: Negative for rash and wound.  Neurological: Negative for dizziness, numbness and headaches.  All other systems reviewed and are negative.     Allergies  Review of patient's allergies indicates no known allergies.  Home Medications   Prior to Admission medications   Medication Sig Start Date End Date Taking? Authorizing Provider  ALPRAZolam Duanne Moron) 0.25 MG tablet TAKE ONE TABLET BY MOUTH THREE TIMES DAILY AS NEEDED 01/19/15   Wardell Honour, MD  amLODipine (NORVASC) 10 MG tablet TAKE ONE TABLET BY MOUTH AT BEDTIME 01/27/15   Wardell Honour, MD  aspirin EC 81 MG tablet Take 81 mg by mouth daily.    Historical Provider, MD  atenolol (TENORMIN) 25 MG tablet TAKE  ONE TABLET BY MOUTH EVERY MORNING 01/27/15   Wardell Honour, MD  citalopram (CELEXA) 20 MG tablet TAKE ONE TABLET BY MOUTH ONE  TIME DAILY 11/29/14   Wardell Honour, MD  Omega-3 Fatty Acids (FISH OIL) 1200 MG CAPS Take 1 capsule by mouth 2 (two) times daily.    Historical Provider, MD  predniSONE (DELTASONE)  20 MG tablet Take 2 tabs daily for 4 days, then take 1 tab daily for 4 days, then take one half tab daily 4 days then stop. 01/27/15   Timmothy Euler, MD  Tamsulosin HCl (FLOMAX) 0.4 MG CAPS Take by mouth daily.      Historical Provider, MD  valsartan-hydrochlorothiazide (DIOVAN-HCT) 160-12.5 MG per tablet TAKE ONE TABLET BY MOUTH ONE TIME DAILY 01/27/15   Wardell Honour, MD   BP 129/68 mmHg  Pulse 60  Temp(Src) 98.4 F (36.9 C) (Oral)  Resp 16  Ht 6' (1.829 m)  Wt 184 lb (83.462 kg)  BMI 24.95 kg/m2  SpO2 96% Physical Exam  Constitutional: He is oriented to person, place, and time. He appears  well-developed and well-nourished.  HENT:  Head: Normocephalic and atraumatic.  Eyes: Conjunctivae and EOM are normal.  Neck: Normal range of motion. Neck supple.  Cardiovascular: Normal rate and regular rhythm.   Decreased right DP pulse  Pulmonary/Chest: Effort normal. No respiratory distress.  Abdominal: Soft. There is no tenderness.  Musculoskeletal: Normal range of motion. He exhibits no edema or tenderness.  Neurological: He is alert and oriented to person, place, and time.  Skin: Skin is warm and dry.  Nursing note and vitals reviewed.   ED Course  Procedures (including critical care time) Labs Review Labs Reviewed  I-STAT CHEM 8, ED - Abnormal; Notable for the following:    Creatinine, Ser 1.60 (*)    Calcium, Ion 0.83 (*)    All other components within normal limits    Imaging Review No results found. I have personally reviewed and evaluated these images and lab results as part of my medical decision-making.   EKG Interpretation None      MDM   Final diagnoses:  Claudication  Acute renal failure, unspecified acute renal failure type   Concern for possible claudication. Creatinine is slightly bumped so we'll not perform CTA here. We'll have him follow-up with his primary doctor for arterial duplex studies. He has an appointment on Wednesday. Also discussed with him about decreasing amount of BC powders is taken as this may be attributed to his increase in creatinine.  I have personally and contemperaneously reviewed labs and imaging and used in my decision making as above.   A medical screening exam was performed and I feel the patient has had an appropriate workup for their chief complaint at this time and likelihood of emergent condition existing is low. They have been counseled on decision, discharge, follow up and which symptoms necessitate immediate return to the emergency department. They or their family verbally stated understanding and agreement with plan  and discharged in stable condition.      Merrily Pew, MD 02/04/15 2043

## 2015-02-04 NOTE — ED Notes (Signed)
Pt had right size arm and leg BP taken by Dr. Dolly Rias  R arm: 115/61 R leg: 137/66

## 2015-02-04 NOTE — ED Notes (Signed)
Right ankle pain, no known injury

## 2015-02-04 NOTE — ED Notes (Signed)
Pt c/o right ankle pain. No redness, swelling noted.

## 2015-02-09 ENCOUNTER — Encounter: Payer: Self-pay | Admitting: Family Medicine

## 2015-02-09 ENCOUNTER — Ambulatory Visit (INDEPENDENT_AMBULATORY_CARE_PROVIDER_SITE_OTHER): Payer: Medicare Other | Admitting: Family Medicine

## 2015-02-09 VITALS — BP 92/55 | HR 48 | Temp 97.8°F | Ht 72.0 in | Wt 188.0 lb

## 2015-02-09 DIAGNOSIS — E785 Hyperlipidemia, unspecified: Secondary | ICD-10-CM | POA: Diagnosis not present

## 2015-02-09 DIAGNOSIS — Z23 Encounter for immunization: Secondary | ICD-10-CM

## 2015-02-09 DIAGNOSIS — R0989 Other specified symptoms and signs involving the circulatory and respiratory systems: Secondary | ICD-10-CM

## 2015-02-09 DIAGNOSIS — I10 Essential (primary) hypertension: Secondary | ICD-10-CM | POA: Diagnosis not present

## 2015-02-09 DIAGNOSIS — M79604 Pain in right leg: Secondary | ICD-10-CM | POA: Diagnosis not present

## 2015-02-09 MED ORDER — METHADONE HCL 5 MG PO TABS: ORAL_TABLET | ORAL | Status: DC

## 2015-02-09 NOTE — Progress Notes (Signed)
Subjective:    Patient ID: Dennis Melton, male    DOB: Aug 23, 1940, 74 y.o.   MRN: [id number]  HPI Pt here for follow up and management of chronic medical problems which includes hypertension and hyperlipidemia. He is taking medications regularly. He is accompanied today by his girlfriend This patient continues with pain in his right lower leg around his ankle and distal calf. There is been no redness swelling fever he was treated initially here for gout and then was went to the emergency room for persistent symptoms and was told there that he had claudication and some degree of chronic kidney disease and referred back here. Patient's pain is worse with walking and weightbearing. He was given oxycodone that has had no effect on the pain. He has a past history of back surgery and similar symptoms but in different areas then.         Patient Active Problem List   Diagnosis Date Noted  . Right ankle pain 01/27/2015  . Generalized anxiety disorder 03/12/2013  . HTN (hypertension) 09/16/2012  . HLD (hyperlipidemia) 09/16/2012  . Heart murmur, systolic 68/34/1962  . Colon polyposis 11/25/2009  . Diverticulosis 11/25/2009   Outpatient Encounter Prescriptions as of 02/09/2015  Medication Sig  . ALPRAZolam (XANAX) 0.25 MG tablet TAKE ONE TABLET BY MOUTH THREE TIMES DAILY AS NEEDED  . amLODipine (NORVASC) 10 MG tablet TAKE ONE TABLET BY MOUTH AT BEDTIME  . aspirin EC 81 MG tablet Take 81 mg by mouth daily.  Marland Kitchen atenolol (TENORMIN) 25 MG tablet TAKE  ONE TABLET BY MOUTH EVERY MORNING  . citalopram (CELEXA) 20 MG tablet TAKE ONE TABLET BY MOUTH ONE  TIME DAILY  . Omega-3 Fatty Acids (FISH OIL) 1200 MG CAPS Take 1 capsule by mouth 2 (two) times daily.  Marland Kitchen oxyCODONE-acetaminophen (PERCOCET) 5-325 MG per tablet Take 1-2 tablets by mouth every 8 (eight) hours as needed.  . Tamsulosin HCl (FLOMAX) 0.4 MG CAPS Take by mouth daily.    . valsartan-hydrochlorothiazide (DIOVAN-HCT) 160-12.5 MG per tablet TAKE  ONE TABLET BY MOUTH ONE TIME DAILY  . [DISCONTINUED] predniSONE (DELTASONE) 20 MG tablet Take 2 tabs daily for 4 days, then take 1 tab daily for 4 days, then take one half tab daily 4 days then stop.   No facility-administered encounter medications on file as of 02/09/2015.     Review of Systems  Constitutional: Negative.   HENT: Negative.   Eyes: Negative.   Respiratory: Negative.   Cardiovascular: Negative.   Gastrointestinal: Negative.   Endocrine: Negative.   Genitourinary: Negative.   Musculoskeletal: Positive for arthralgias (right lower leg).  Skin: Negative.   Allergic/Immunologic: Negative.   Neurological: Negative.   Hematological: Negative.   Psychiatric/Behavioral: Negative.        Objective:   Physical Exam  Constitutional: He appears well-developed and well-nourished.  Cardiovascular: Normal rate.   Pulmonary/Chest: Effort normal and breath sounds normal.  Musculoskeletal:  Right leg: I can palpate no pulses either posterior tibia or dorsal pulse. Reflexes are absent in both ankles and the knees also show some difference that may be attributed to previous surgery. Straight leg raising is negative. He is unable to walk on his toes or heels due to pain. In observing his gait. There is favoring of the right leg and the foot fever with walking. I am unsure of his baseline gait appearance.    BP 92/55 mmHg  Pulse 48  Temp(Src) 97.8 F (36.6 C) (Oral)  Ht 6' (1.829 m)  [facility]  188 lb (85.276 kg)  BMI 25.49 kg/m2       Assessment & Plan:  1. Hyperlipidemia Lipids are stable. Focus today is on leg pain  2. Essential hypertension Blood pressure today is good 92/55.  3. Decreased pedal pulses Goal here is to try to figure out what's causing his leg pain I think it's not likely to be gout but is either circulatory or related to back and disc disease. Will obtain Doppler ultrasound to rule out arterial obstruction but if that is negative would then go to MRI of his  back to look for disc disease. - [place] Lower Ext Art Right; Future  4. Lower extremity pain, right See above for discussion  Wardell Honour MD - [place] Lower Ext Art Right; Future

## 2015-02-09 NOTE — Patient Instructions (Signed)
Medicare Annual Wellness Visit  [place] and the medical providers at [facility] strive to bring you the best medical care.  In doing so we not only want to address your current medical conditions and concerns but also to detect new conditions early and prevent illness, disease and health-related problems.    Medicare offers a yearly Wellness Visit which allows our clinical staff to assess your need for preventative services including immunizations, lifestyle education, counseling to decrease risk of preventable diseases and screening for fall risk and other medical concerns.    This visit is provided free of charge (no copay) for all Medicare recipients. The clinical pharmacists at [facility] have begun to conduct these Wellness Visits which will also include a thorough review of all your medications.    As you primary medical provider recommend that you make an appointment for your Annual Wellness Visit if you have not done so already this year.  You may set up this appointment before you leave today or you may call back ([phone]) and schedule an appointment.  Please make sure when you call that you mention that you are scheduling your Annual Wellness Visit with the clinical pharmacist so that the appointment may be made for the proper length of time.     Continue current medications. Continue good therapeutic lifestyle changes which include good diet and exercise. Fall precautions discussed with patient. If an FOBT was given today- please return it to our front desk. If you are over 50 years old - you may need Prevnar 13 or the adult Pneumonia vaccine.  **Flu shots will be available soon--- please call and schedule a FLU-CLINIC appointment**  After your visit with us today you will receive a survey in the mail or online from [employer] regarding your care with us. Please take a moment to fill this out. Your feedback is  very important to us as you can help us better understand your patient needs as well as improve your experience and satisfaction. WE CARE ABOUT YOU!!!   **Please join us SEPT.22, 2016 from 5:00 to 7:00pm for our OPEN HOUSE! Come out and meet our NEW providers**  

## 2015-02-11 ENCOUNTER — Telehealth: Payer: Self-pay | Admitting: Family Medicine

## 2015-02-17 ENCOUNTER — Ambulatory Visit (HOSPITAL_COMMUNITY)
Admission: RE | Admit: 2015-02-17 | Discharge: 2015-02-17 | Disposition: A | Discharge status: Home / Self Care | Payer: Medicare Other | Source: Ambulatory Visit | Attending: Family Medicine | Admitting: Family Medicine

## 2015-02-17 DIAGNOSIS — M25571 Pain in right ankle and joints of right foot: Secondary | ICD-10-CM | POA: Insufficient documentation

## 2015-02-17 DIAGNOSIS — I10 Essential (primary) hypertension: Secondary | ICD-10-CM | POA: Insufficient documentation

## 2015-02-17 DIAGNOSIS — Z720 Tobacco use: Secondary | ICD-10-CM | POA: Insufficient documentation

## 2015-02-17 DIAGNOSIS — R0989 Other specified symptoms and signs involving the circulatory and respiratory systems: Secondary | ICD-10-CM

## 2015-02-17 DIAGNOSIS — M79604 Pain in right leg: Secondary | ICD-10-CM

## 2015-02-19 ENCOUNTER — Other Ambulatory Visit: Payer: Self-pay | Admitting: Family Medicine

## 2015-02-21 NOTE — Telephone Encounter (Signed)
Last filled 01/19/15, last seen 02/09/15. Call in at [facility] if approved

## 2015-02-23 ENCOUNTER — Other Ambulatory Visit: Payer: Self-pay | Admitting: Family Medicine

## 2015-03-10 ENCOUNTER — Telehealth: Payer: Self-pay | Admitting: Family Medicine

## 2015-03-10 ENCOUNTER — Other Ambulatory Visit: Payer: Self-pay | Admitting: Family Medicine

## 2015-03-10 MED ORDER — OXYCODONE-ACETAMINOPHEN 5-325 MG PO TABS: 1.0000 | ORAL_TABLET | Freq: Three times a day (TID) | ORAL | Status: DC | PRN

## 2015-03-10 NOTE — Telephone Encounter (Signed)
Please enter referral if approved

## 2015-03-10 NOTE — Telephone Encounter (Signed)
Rx refilled per patient request 

## 2015-03-10 NOTE — Telephone Encounter (Signed)
Left message stating that RX will be ready for pick up tomorrow.

## 2015-03-10 NOTE — Telephone Encounter (Signed)
[place]

## 2015-03-14 ENCOUNTER — Other Ambulatory Visit: Payer: Self-pay

## 2015-03-14 DIAGNOSIS — M79604 Pain in right leg: Secondary | ICD-10-CM

## 2015-03-16 ENCOUNTER — Other Ambulatory Visit: Payer: Self-pay | Admitting: Family Medicine

## 2015-03-16 MED ORDER — OXYCODONE-ACETAMINOPHEN 5-325 MG PO TABS: 1.0000 | ORAL_TABLET | Freq: Three times a day (TID) | ORAL | Status: DC | PRN

## 2015-03-16 NOTE — Telephone Encounter (Signed)
Filled 03/10/15 for #20

## 2015-03-17 ENCOUNTER — Telehealth: Payer: Self-pay | Admitting: Family Medicine

## 2015-03-17 NOTE — Telephone Encounter (Signed)
This was already taken care of - pt aware  rx up front

## 2015-03-18 NOTE — Telephone Encounter (Signed)
[facility] up front for pt p/u; Pt aware

## 2015-03-21 DIAGNOSIS — M7671 Peroneal tendinitis, right leg: Secondary | ICD-10-CM | POA: Diagnosis not present

## 2015-03-21 DIAGNOSIS — M21372 Foot drop, left foot: Secondary | ICD-10-CM | POA: Diagnosis not present

## 2015-03-25 ENCOUNTER — Other Ambulatory Visit: Payer: Self-pay | Admitting: Family Medicine

## 2015-03-28 ENCOUNTER — Other Ambulatory Visit: Payer: Self-pay | Admitting: Family Medicine

## 2015-03-28 NOTE — Telephone Encounter (Signed)
Last seen 02/09/15  Dr Sabra Heck If approved route to nurse to call into [facility]

## 2015-03-29 NOTE — Telephone Encounter (Signed)
Refill called to [facility]

## 2015-03-29 NOTE — Telephone Encounter (Signed)
Refill on Xanax last seen 9/28 last filled 02/21/15

## 2015-03-29 NOTE — Telephone Encounter (Signed)
Last filled 02/21/15, last seen 02/09/15. Call in at [facility]

## 2015-05-23 ENCOUNTER — Other Ambulatory Visit: Payer: Self-pay | Admitting: Family Medicine

## 2015-05-24 ENCOUNTER — Other Ambulatory Visit: Payer: Self-pay | Admitting: Family Medicine

## 2015-05-24 NOTE — Telephone Encounter (Signed)
Last seen 02/09/15  Dr Sabra Heck  If approved route to nurse to call into [facility]

## 2015-05-25 NOTE — Telephone Encounter (Signed)
Xanax called to voice mail.

## 2015-06-20 ENCOUNTER — Other Ambulatory Visit: Payer: Self-pay | Admitting: Family Medicine

## 2015-06-21 MED ORDER — ALPRAZOLAM 0.25 MG PO TABS: 0.2500 mg | ORAL_TABLET | Freq: Three times a day (TID) | ORAL | Status: DC | PRN

## 2015-06-21 NOTE — Telephone Encounter (Signed)
Last seen 02/09/15  Dr Sabra Heck  If approved route to nurse to call into [facility]

## 2015-07-14 ENCOUNTER — Ambulatory Visit (INDEPENDENT_AMBULATORY_CARE_PROVIDER_SITE_OTHER): Payer: Medicare Other | Admitting: Family Medicine

## 2015-07-14 ENCOUNTER — Ambulatory Visit (INDEPENDENT_AMBULATORY_CARE_PROVIDER_SITE_OTHER): Payer: Medicare Other

## 2015-07-14 ENCOUNTER — Encounter: Payer: Self-pay | Admitting: Family Medicine

## 2015-07-14 VITALS — BP 136/69 | HR 56 | Temp 96.9°F | Ht 72.0 in | Wt 193.0 lb

## 2015-07-14 DIAGNOSIS — M25571 Pain in right ankle and joints of right foot: Secondary | ICD-10-CM

## 2015-07-14 DIAGNOSIS — S82401A Unspecified fracture of shaft of right fibula, initial encounter for closed fracture: Secondary | ICD-10-CM

## 2015-07-14 MED ORDER — HYDROCODONE-ACETAMINOPHEN 5-325 MG PO TABS: 1.0000 | ORAL_TABLET | Freq: Two times a day (BID) | ORAL | Status: DC | PRN

## 2015-07-14 NOTE — Progress Notes (Signed)
   Subjective:    Patient ID: Dennis Melton, male    DOB: 06-Dec-1940, 75 y.o.   MRN: [id number]  HPI Patient here today for right ankle pain from a fall 1 and 1/2 weeks ago. Patient reports that he has pain worse at night that keeps him awake and that swelling has not resolved. He is able to ambulate on the ankle without excessive pain. He does not really recall mechanism of injury since he was somewhat intoxicated, by history. Pain is more localized medially than laterally even though on exam the discoloration and swelling is more lateral.     Patient Active Problem List   Diagnosis Date Noted  . Right ankle pain 01/27/2015  . Generalized anxiety disorder 03/12/2013  . HTN (hypertension) 09/16/2012  . HLD (hyperlipidemia) 09/16/2012  . Heart murmur, systolic [id number]  . Colon polyposis 11/25/2009  . Diverticulosis 11/25/2009   Outpatient Encounter Prescriptions as of 07/14/2015  Medication Sig  . ALPRAZolam (XANAX) 0.25 MG tablet Take 1 tablet (0.25 mg total) by mouth 3 (three) times daily as needed.  Marland Kitchen amLODipine (NORVASC) 10 MG tablet TAKE ONE TABLET BY MOUTH AT BEDTIME  . aspirin EC 81 MG tablet Take 81 mg by mouth daily.  Marland Kitchen atenolol (TENORMIN) 25 MG tablet TAKE  ONE TABLET BY MOUTH EVERY MORNING  . citalopram (CELEXA) 20 MG tablet TAKE ONE TABLET BY MOUTH ONE TIME DAILY  . Omega-3 Fatty Acids (FISH OIL) 1200 MG CAPS Take 1 capsule by mouth 2 (two) times daily.  . Tamsulosin HCl (FLOMAX) 0.4 MG CAPS Take by mouth daily.    . valsartan-hydrochlorothiazide (DIOVAN-HCT) 160-12.5 MG tablet TAKE ONE TABLET BY MOUTH ONE TIME DAILY  . [DISCONTINUED] methadone (DOLOPHINE) 5 MG tablet 0.5 - 1 tab TID PRN  . [DISCONTINUED] oxyCODONE-acetaminophen (PERCOCET) 5-325 MG tablet Take 1-2 tablets by mouth every 8 (eight) hours as needed.   No facility-administered encounter medications on file as of 07/14/2015.      Review of Systems  Constitutional: Negative.   HENT: Negative.   Eyes:  Negative.   Respiratory: Negative.   Cardiovascular: Negative.   Gastrointestinal: Negative.   Endocrine: Negative.   Genitourinary: Negative.   Musculoskeletal: Positive for arthralgias (right foot/ankle pain and bruising).  Skin: Negative.   Allergic/Immunologic: Negative.   Neurological: Negative.   Hematological: Negative.   Psychiatric/Behavioral: Negative.        Objective:   Physical Exam  Constitutional: He appears well-developed and well-nourished.  Musculoskeletal:  Right ankle: 3+ swollen and blue over lateral malleolus extending onto foot and toes. He is able to abduct and abduct foot and strength is equal in either plane. There is tenderness both medial and lateral malleoli.  X-ray shows comminuted fracture of the lateral malleolus/fibula.   BP 136/69 mmHg  Pulse 56  Temp(Src) 96.9 F (36.1 C) (Oral)  Ht 6' (1.829 m)  Wt 193 lb (87.544 kg)  BMI 26.17 kg/m2        Assessment & Plan:  1. Right ankle pain Dose comminuted fracture of the lateral malleolus - DG Ankle Complete Right; Future  2. Closed fibular fracture, right, initial encounter Refer to orthopedics. In the meantime apply Jones dressing for swelling. Patient has crutches at home. I've asked him to limit weightbearing and use crutches. Rx for hydrocodone 5 mg. Keep elevated as much as possible.  Wardell Honour MD

## 2015-07-18 ENCOUNTER — Other Ambulatory Visit: Payer: Self-pay | Admitting: Family Medicine

## 2015-07-18 DIAGNOSIS — S82401A Unspecified fracture of shaft of right fibula, initial encounter for closed fracture: Secondary | ICD-10-CM | POA: Diagnosis not present

## 2015-07-18 DIAGNOSIS — M25571 Pain in right ankle and joints of right foot: Secondary | ICD-10-CM | POA: Diagnosis not present

## 2015-08-01 DIAGNOSIS — S82401A Unspecified fracture of shaft of right fibula, initial encounter for closed fracture: Secondary | ICD-10-CM | POA: Diagnosis not present

## 2015-08-11 ENCOUNTER — Encounter: Payer: Self-pay | Admitting: Family Medicine

## 2015-08-11 ENCOUNTER — Ambulatory Visit (INDEPENDENT_AMBULATORY_CARE_PROVIDER_SITE_OTHER): Payer: Medicare Other | Admitting: Family Medicine

## 2015-08-11 VITALS — BP 124/67 | HR 60 | Temp 97.2°F | Ht 72.0 in | Wt 192.0 lb

## 2015-08-11 DIAGNOSIS — E785 Hyperlipidemia, unspecified: Secondary | ICD-10-CM

## 2015-08-11 DIAGNOSIS — F411 Generalized anxiety disorder: Secondary | ICD-10-CM | POA: Diagnosis not present

## 2015-08-11 DIAGNOSIS — I10 Essential (primary) hypertension: Secondary | ICD-10-CM

## 2015-08-11 MED ORDER — ALPRAZOLAM 0.25 MG PO TABS: 0.2500 mg | ORAL_TABLET | Freq: Three times a day (TID) | ORAL | Status: DC | PRN

## 2015-08-11 MED ORDER — ALPRAZOLAM 0.25 MG PO TABS
0.2500 mg | ORAL_TABLET | Freq: Three times a day (TID) | ORAL | Status: DC | PRN
Start: 2015-08-11 — End: 2015-08-11

## 2015-08-11 NOTE — Progress Notes (Signed)
   Subjective:    Patient ID: Dennis Melton, male    DOB: [birth year], 75 y.o.   MRN: [id number]  HPI 75 year old gentleman here to follow-up hypertension, BPH, and anxiety. Generally, he is doing very well. He has no complaints today. He has been monitoring blood pressure at home and it has been good there to. Regarding BPH, no complaints of nocturia. Continues with Flomax    Review of Systems  Constitutional: Negative.   Respiratory: Negative.   Cardiovascular: Negative.   Genitourinary: Negative.   Neurological: Negative.   Psychiatric/Behavioral: Negative.       BP 124/67 mmHg  Pulse 60  Temp(Src) 97.2 F (36.2 C) (Oral)  Ht 6' (1.829 m)  Wt 192 lb (87.091 kg)  BMI 26.03 kg/m2   Objective:   Physical Exam  Constitutional: He is oriented to person, place, and time. He appears well-developed and well-nourished.  HENT:  Head: Normocephalic.  Pulmonary/Chest: Effort normal and breath sounds normal.  Abdominal: Soft.  Neurological: He is alert and oriented to person, place, and time.          Assessment & Plan:  1. Essential hypertension Blood was last checked 1 year ago and everything looked good. Weight is stable. He takes no statins. - Lipid panel - CMP14+EGFR  2. HLD (hyperlipidemia) See above - Lipid panel - CMP14+EGFR  3. Generalized anxiety disorder Using citalopram and alprazolam. Symptoms are well controlled - Lipid panel - CMP14+EGFR  Wardell Honour MD

## 2015-08-12 ENCOUNTER — Encounter: Payer: Self-pay | Admitting: *Deleted

## 2015-08-12 LAB — CMP14+EGFR
ALBUMIN: 4.4 g/dL (ref 3.5–4.8)
ALK PHOS: 98 IU/L (ref 39–117)
ALT: 12 IU/L (ref 0–44)
AST: 17 IU/L (ref 0–40)
Albumin/Globulin Ratio: 1.8 (ref 1.2–2.2)
BILIRUBIN TOTAL: 0.3 mg/dL (ref 0.0–1.2)
BUN / CREAT RATIO: 10 (ref 10–22)
BUN: 10 mg/dL (ref 8–27)
CHLORIDE: 99 mmol/L (ref 96–106)
CO2: 24 mmol/L (ref 18–29)
Calcium: 9 mg/dL (ref 8.6–10.2)
Creatinine, Ser: 1.01 mg/dL (ref 0.76–1.27)
GFR calc Af Amer: 85 mL/min/{1.73_m2} (ref >59)
GFR calc non Af Amer: 73 mL/min/{1.73_m2} (ref >59)
GLUCOSE: 100 mg/dL — AB (ref 65–99)
Globulin, Total: 2.4 g/dL (ref 1.5–4.5)
Potassium: 4.3 mmol/L (ref 3.5–5.2)
Sodium: 140 mmol/L (ref 134–144)
Total Protein: 6.8 g/dL (ref 6.0–8.5)

## 2015-08-12 LAB — LIPID PANEL
CHOL/HDL RATIO: 4.5 ratio (ref 0.0–5.0)
Cholesterol, Total: 149 mg/dL (ref 100–199)
HDL: 33 mg/dL — AB (ref >39)
LDL Calculated: 82 mg/dL (ref 0–99)
Triglycerides: 171 mg/dL — ABNORMAL HIGH (ref 0–149)
VLDL CHOLESTEROL CAL: 34 mg/dL (ref 5–40)

## 2015-08-18 ENCOUNTER — Other Ambulatory Visit: Payer: Self-pay | Admitting: Family Medicine

## 2015-09-02 DIAGNOSIS — S82401A Unspecified fracture of shaft of right fibula, initial encounter for closed fracture: Secondary | ICD-10-CM | POA: Diagnosis not present

## 2015-09-11 ENCOUNTER — Emergency Department (HOSPITAL_COMMUNITY)
Admission: EM | Admit: 2015-09-11 | Discharge: 2015-09-11 | Disposition: A | Discharge status: Home / Self Care | Payer: Medicare Other | Attending: Emergency Medicine | Admitting: Emergency Medicine

## 2015-09-11 ENCOUNTER — Encounter (HOSPITAL_COMMUNITY): Payer: Self-pay | Admitting: Emergency Medicine

## 2015-09-11 DIAGNOSIS — F17200 Nicotine dependence, unspecified, uncomplicated: Secondary | ICD-10-CM | POA: Diagnosis not present

## 2015-09-11 DIAGNOSIS — I10 Essential (primary) hypertension: Secondary | ICD-10-CM | POA: Diagnosis not present

## 2015-09-11 DIAGNOSIS — R1013 Epigastric pain: Secondary | ICD-10-CM | POA: Insufficient documentation

## 2015-09-11 DIAGNOSIS — Z7982 Long term (current) use of aspirin: Secondary | ICD-10-CM | POA: Insufficient documentation

## 2015-09-11 DIAGNOSIS — Z79899 Other long term (current) drug therapy: Secondary | ICD-10-CM | POA: Diagnosis not present

## 2015-09-11 DIAGNOSIS — E119 Type 2 diabetes mellitus without complications: Secondary | ICD-10-CM | POA: Insufficient documentation

## 2015-09-11 DIAGNOSIS — F329 Major depressive disorder, single episode, unspecified: Secondary | ICD-10-CM | POA: Diagnosis not present

## 2015-09-11 LAB — LIPASE, BLOOD: Lipase: 30 U/L (ref 11–51)

## 2015-09-11 LAB — URINE MICROSCOPIC-ADD ON

## 2015-09-11 LAB — COMPREHENSIVE METABOLIC PANEL
ALT: 23 U/L (ref 17–63)
AST: 23 U/L (ref 15–41)
Albumin: 4.7 g/dL (ref 3.5–5.0)
Alkaline Phosphatase: 106 U/L (ref 38–126)
Anion gap: 13 (ref 5–15)
BUN: 17 mg/dL (ref 6–20)
CO2: 23 mmol/L (ref 22–32)
Calcium: 9.5 mg/dL (ref 8.9–10.3)
Chloride: 101 mmol/L (ref 101–111)
Creatinine, Ser: 1.06 mg/dL (ref 0.61–1.24)
GFR calc Af Amer: >60 mL/min (ref >60)
GFR calc non Af Amer: >60 mL/min (ref >60)
Glucose, Bld: 114 mg/dL — ABNORMAL HIGH (ref 65–99)
Potassium: 4.7 mmol/L (ref 3.5–5.1)
Sodium: 137 mmol/L (ref 135–145)
Total Bilirubin: 1.2 mg/dL (ref 0.3–1.2)
Total Protein: 8.3 g/dL — ABNORMAL HIGH (ref 6.5–8.1)

## 2015-09-11 LAB — CBC
HCT: 46.6 % (ref 39.0–52.0)
Hemoglobin: 16.7 g/dL (ref 13.0–17.0)
MCH: 31.3 pg (ref 26.0–34.0)
MCHC: 35.8 g/dL (ref 30.0–36.0)
MCV: 87.3 fL (ref 78.0–100.0)
Platelets: 123 10*3/uL — ABNORMAL LOW (ref 150–400)
RBC: 5.34 MIL/uL (ref 4.22–5.81)
RDW: 14 % (ref 11.5–15.5)
WBC: 6.6 10*3/uL (ref 4.0–10.5)

## 2015-09-11 LAB — URINALYSIS, ROUTINE W REFLEX MICROSCOPIC
Glucose, UA: NEGATIVE mg/dL
Leukocytes, UA: NEGATIVE
Nitrite: NEGATIVE
Protein, ur: 30 mg/dL — AB
Specific Gravity, Urine: 1.025 (ref 1.005–1.030)
pH: 5 (ref 5.0–8.0)

## 2015-09-11 MED ORDER — PANTOPRAZOLE SODIUM 40 MG PO TBEC
40.0000 mg | DELAYED_RELEASE_TABLET | Freq: Every day | ORAL | Status: DC
  Administered 2015-09-11: 40 mg via ORAL
  Filled 2015-09-11: qty 1

## 2015-09-11 MED ORDER — LORAZEPAM 1 MG PO TABS: 1.0000 mg | ORAL_TABLET | Freq: Two times a day (BID) | ORAL | Status: DC | PRN

## 2015-09-11 MED ORDER — OMEPRAZOLE 20 MG PO CPDR: 20.0000 mg | DELAYED_RELEASE_CAPSULE | Freq: Two times a day (BID) | ORAL | Status: DC

## 2015-09-11 MED ORDER — FAMOTIDINE 20 MG PO TABS
20.0000 mg | ORAL_TABLET | Freq: Once | ORAL | Status: AC
  Administered 2015-09-11: 20 mg via ORAL
  Filled 2015-09-11: qty 1

## 2015-09-11 MED ORDER — LORAZEPAM 1 MG PO TABS
1.0000 mg | ORAL_TABLET | Freq: Once | ORAL | Status: AC
  Administered 2015-09-11: 1 mg via ORAL
  Filled 2015-09-11: qty 1

## 2015-09-11 NOTE — ED Provider Notes (Signed)
CSN: [id number]     Arrival date & time 09/11/15  1606 History   First MD Initiated Contact with Patient 09/11/15 1747     Chief Complaint  Patient presents with  . Abdominal Pain     (Consider location/radiation/quality/duration/timing/severity/associated sxs/prior Treatment) HPI   75 year old male with epigastric/lower sternal pain. Onset 3-4 days ago. Persistent since then. Burning sensation. Sometimes radiates into his throat and has a bad taste in his mouth. Frequent burping. Loss of appetite. No vomiting. No urinary complaints. No diarrhea. His everyday smoker. Also abuses alcohol.  Past Medical History  Diagnosis Date  . Hypertension   . Diabetes mellitus   . High cholesterol   . Anxiety   . Depression    Past Surgical History  Procedure Laterality Date  . Back surgery      lumbar laminectomy  . Cataract extraction w/phaco Left 06/18/2013    Procedure: CATARACT EXTRACTION PHACO AND INTRAOCULAR LENS PLACEMENT (IOC);  Surgeon: Tonny Branch, MD;  Location: [facility];  Service: Ophthalmology;  Laterality: Left;  CDE:12.51   Family History  Problem Relation Age of Onset  . Diabetes Father   . Anxiety disorder Brother   . Heart disease Brother    Social History  Substance Use Topics  . Smoking status: Current Some Day Smoker -- 2.00 packs/day for 30 years    Types: Cigars  . Smokeless tobacco: None  . Alcohol Use: Yes     Comment: occassionally    Review of Systems  All systems reviewed and negative, other than as noted in HPI.    Allergies  Review of patient's allergies indicates no known allergies.  Home Medications   Prior to Admission medications   Medication Sig Start Date End Date Taking? Authorizing Provider  ALPRAZolam (XANAX) 0.25 MG tablet Take 1 tablet (0.25 mg total) by mouth 3 (three) times daily as needed. 08/11/15  Yes Chipper Herb, MD  amLODipine (NORVASC) 10 MG tablet TAKE ONE TABLET BY MOUTH AT BEDTIME 02/23/15  Yes Wardell Honour, MD  aspirin  EC 81 MG tablet Take 81 mg by mouth daily.   Yes Historical Provider, MD  Aspirin-Caffeine 845-65 MG PACK Take 1 Package by mouth as needed (Pain).   Yes Historical Provider, MD  atenolol (TENORMIN) 25 MG tablet TAKE ONE TABLET BY MOUTH ONCE DAILY IN THE MORNING 08/18/15  Yes Wardell Honour, MD  citalopram (CELEXA) 20 MG tablet TAKE ONE TABLET BY MOUTH ONCE DAILY 07/18/15  Yes Wardell Honour, MD  Omega-3 Fatty Acids (FISH OIL) 1200 MG CAPS Take 1 capsule by mouth 2 (two) times daily.   Yes Historical Provider, MD  Tamsulosin HCl (FLOMAX) 0.4 MG CAPS Take 0.4 mg by mouth daily.    Yes Historical Provider, MD  valsartan-hydrochlorothiazide (DIOVAN-HCT) 160-12.5 MG tablet TAKE ONE TABLET BY MOUTH ONCE DAILY 08/18/15  Yes Wardell Honour, MD   BP 168/76 mmHg  Pulse 79  Temp(Src) 98 F (36.7 C) (Oral)  Resp 16  Ht 6' (1.829 m)  Wt 192 lb (87.091 kg)  BMI 26.03 kg/m2  SpO2 95% Physical Exam  Constitutional: He is oriented to person, place, and time. He appears well-developed and well-nourished. No distress.  HENT:  Head: Normocephalic and atraumatic.  Eyes: Conjunctivae are normal. Right eye exhibits no discharge. Left eye exhibits no discharge.  Neck: Neck supple.  Cardiovascular: Normal rate, regular rhythm and normal heart sounds.  Exam reveals no gallop and no friction rub.   No murmur heard. Pulmonary/Chest: Effort  normal and breath sounds normal. No respiratory distress.  Abdominal: Soft. He exhibits no distension. There is tenderness.  Mild tenderness in epigastrium without rebound or guarding. No distention.  Musculoskeletal: He exhibits no edema or tenderness.  Neurological: He is alert and oriented to person, place, and time. No cranial nerve deficit. He exhibits normal muscle tone.  Mildly tremulous.  Skin: Skin is warm and dry.  Psychiatric: He has a normal mood and affect. His behavior is normal. Thought content normal.  Nursing note and vitals reviewed.   ED Course   Procedures (including critical care time) Labs Review Labs Reviewed  COMPREHENSIVE METABOLIC PANEL - Abnormal; Notable for the following:    Glucose, Bld 114 (*)    Total Protein 8.3 (*)    All other components within normal limits  CBC - Abnormal; Notable for the following:    Platelets 123 (*)    All other components within normal limits  URINALYSIS, ROUTINE W REFLEX MICROSCOPIC (NOT AT [facility]) - Abnormal; Notable for the following:    APPearance HAZY (*)    Hgb urine dipstick SMALL (*)    Bilirubin Urine SMALL (*)    Ketones, ur TRACE (*)    Protein, ur 30 (*)    All other components within normal limits  URINE MICROSCOPIC-ADD ON - Abnormal; Notable for the following:    Squamous Epithelial / LPF 0-5 (*)    Bacteria, UA FEW (*)    All other components within normal limits  LIPASE, BLOOD    Imaging Review No results found. I have personally reviewed and evaluated these images and lab results as part of my medical decision-making.   EKG Interpretation None      MDM   Final diagnoses:  Dyspepsia    [name] with  upper abdomen/lower chest discomfort. Strongly suspect GI etiology. Esophagitis/PUD/gastritis/etc. Doubt ACS.   Vague discomfort. Anorexia. Frequent burping. Sometimes odd taste and burning in throat.  Worse when trying to sleep. Will give trial of PPI. Alcohol use may be aggravating or precipitating GI symptoms and withdrawal may be contributing with difficulty sleeping too. Mildly tremulous on exam. Hypertensive. He reports feeling somewhat "antsy." He reports he can drink quit heavily but has cut back in the last several days because of other symptoms. Will give a prescription for a small amount of ativan. Should help with this as well as sleep.   Labs fairly unremarkable. His abdominal exam is benign. I do not think these is a role in the ED for imaging at this time. Return precautions discussed. Outpt FU otherwise.     Virgel Manifold, MD 09/18/15 [phone]

## 2015-09-11 NOTE — ED Notes (Signed)
Having pain to left lower abdomen and discomfort goes to upper mid abdomen.  Denies pain but says it is uncomfortable.  C/o weakness for last last 3 days and poor appetite.

## 2015-09-28 ENCOUNTER — Other Ambulatory Visit: Payer: Self-pay

## 2015-09-28 MED ORDER — OMEPRAZOLE 20 MG PO CPDR: 20.0000 mg | DELAYED_RELEASE_CAPSULE | Freq: Two times a day (BID) | ORAL | Status: DC

## 2015-09-28 MED ORDER — CITALOPRAM HYDROBROMIDE 20 MG PO TABS
20.0000 mg | ORAL_TABLET | Freq: Every day | ORAL | Status: DC
Start: 2015-09-28 — End: 2015-11-17

## 2015-10-10 ENCOUNTER — Other Ambulatory Visit: Payer: Self-pay | Admitting: Family Medicine

## 2015-10-12 ENCOUNTER — Other Ambulatory Visit: Payer: Self-pay | Admitting: Family Medicine

## 2015-10-12 NOTE — Telephone Encounter (Signed)
Xanax refill left on voice mail.

## 2015-10-12 NOTE — Telephone Encounter (Signed)
Last filled 09/12/15, last seen 08/11/15. Call in at [facility]

## 2015-11-11 ENCOUNTER — Other Ambulatory Visit: Payer: Self-pay | Admitting: Family Medicine

## 2015-11-11 NOTE — Telephone Encounter (Signed)
Last filled 10/12/15, last seen 08/11/15. Call in at [facility]

## 2015-11-17 ENCOUNTER — Ambulatory Visit (INDEPENDENT_AMBULATORY_CARE_PROVIDER_SITE_OTHER): Payer: Medicare Other | Admitting: Family Medicine

## 2015-11-17 ENCOUNTER — Encounter: Payer: Self-pay | Admitting: Family Medicine

## 2015-11-17 VITALS — BP 126/60 | HR 55 | Temp 97.2°F | Ht 72.0 in | Wt 187.2 lb

## 2015-11-17 DIAGNOSIS — I10 Essential (primary) hypertension: Secondary | ICD-10-CM

## 2015-11-17 DIAGNOSIS — F411 Generalized anxiety disorder: Secondary | ICD-10-CM

## 2015-11-17 DIAGNOSIS — R011 Cardiac murmur, unspecified: Secondary | ICD-10-CM

## 2015-11-17 DIAGNOSIS — Z23 Encounter for immunization: Secondary | ICD-10-CM

## 2015-11-17 MED ORDER — AMLODIPINE BESYLATE 10 MG PO TABS: 10.0000 mg | ORAL_TABLET | Freq: Every day | ORAL | Status: DC

## 2015-11-17 MED ORDER — ATENOLOL 25 MG PO TABS: ORAL_TABLET | ORAL | Status: DC

## 2015-11-17 MED ORDER — VALSARTAN-HYDROCHLOROTHIAZIDE 160-12.5 MG PO TABS: 1.0000 | ORAL_TABLET | Freq: Every day | ORAL | Status: DC

## 2015-11-17 MED ORDER — OMEPRAZOLE 20 MG PO CPDR: 20.0000 mg | DELAYED_RELEASE_CAPSULE | Freq: Two times a day (BID) | ORAL | Status: DC

## 2015-11-17 MED ORDER — ALPRAZOLAM 0.25 MG PO TABS: 0.2500 mg | ORAL_TABLET | Freq: Three times a day (TID) | ORAL | Status: DC | PRN

## 2015-11-17 MED ORDER — CITALOPRAM HYDROBROMIDE 20 MG PO TABS: 20.0000 mg | ORAL_TABLET | Freq: Every day | ORAL | Status: DC

## 2015-11-17 MED ORDER — TAMSULOSIN HCL 0.4 MG PO CAPS: 0.4000 mg | ORAL_CAPSULE | Freq: Every day | ORAL | Status: DC

## 2015-11-17 NOTE — Progress Notes (Signed)
Subjective:    Patient ID: Dennis Melton, male    DOB: 1941-03-19, 75 y.o.   MRN: [id number]  HPI 75 year old gentleman here to follow-up chronic problems including hypertension, anxiety, he was visited recently by nurse practitioner who came to his home. She had some concerns including toenail fungus area also question about Prevnar shot which he will get today it  Patient Active Problem List   Diagnosis Date Noted  . Right ankle pain 01/27/2015  . Generalized anxiety disorder 03/12/2013  . HTN (hypertension) 09/16/2012  . HLD (hyperlipidemia) 09/16/2012  . Heart murmur, systolic [id number]  . Colon polyposis 11/25/2009  . Diverticulosis 11/25/2009   Outpatient Encounter Prescriptions as of 11/17/2015  Medication Sig  . ALPRAZolam (XANAX) 0.25 MG tablet Take 1 tablet (0.25 mg total) by mouth 3 (three) times daily as needed.  Marland Kitchen amLODipine (NORVASC) 10 MG tablet Take 1 tablet (10 mg total) by mouth daily with breakfast.  . Aspirin-Caffeine 845-65 MG PACK Take 1 Package by mouth as needed (Pain).  Marland Kitchen atenolol (TENORMIN) 25 MG tablet TAKE ONE TABLET BY MOUTH ONCE DAILY IN THE MORNING  . cholecalciferol (VITAMIN D) 1000 units tablet Take 1,000 Units by mouth daily.  . citalopram (CELEXA) 20 MG tablet Take 1 tablet (20 mg total) by mouth daily.  . Omega-3 Fatty Acids (FISH OIL) 1200 MG CAPS Take 1 capsule by mouth 2 (two) times daily.  Marland Kitchen omeprazole (PRILOSEC) 20 MG capsule Take 1 capsule (20 mg total) by mouth 2 (two) times daily before a meal.  . tamsulosin (FLOMAX) 0.4 MG CAPS capsule Take 1 capsule (0.4 mg total) by mouth daily.  . valsartan-hydrochlorothiazide (DIOVAN-HCT) 160-12.5 MG tablet Take 1 tablet by mouth daily.  . [DISCONTINUED] ALPRAZolam (XANAX) 0.25 MG tablet TAKE ONE TABLET BY MOUTH THREE TIMES DAILY AS NEEDED  . [DISCONTINUED] amLODipine (NORVASC) 10 MG tablet TAKE ONE TABLET BY MOUTH ONCE DAILY AT BEDTIME  . [DISCONTINUED] atenolol (TENORMIN) 25 MG tablet TAKE ONE TABLET BY  MOUTH ONCE DAILY IN THE MORNING  . [DISCONTINUED] citalopram (CELEXA) 20 MG tablet Take 1 tablet (20 mg total) by mouth daily.  . [DISCONTINUED] Tamsulosin HCl (FLOMAX) 0.4 MG CAPS Take 0.4 mg by mouth daily.   . [DISCONTINUED] valsartan-hydrochlorothiazide (DIOVAN-HCT) 160-12.5 MG tablet TAKE ONE TABLET BY MOUTH ONCE DAILY  . aspirin EC 81 MG tablet Take 81 mg by mouth daily. Reported on 11/17/2015  . LORazepam (ATIVAN) 1 MG tablet Take 1 tablet (1 mg total) by mouth 2 (two) times daily as needed for sleep. (Patient not taking: Reported on 11/17/2015)   No facility-administered encounter medications on file as of 11/17/2015.      Review of Systems  Constitutional: Negative.   HENT: Negative.   Eyes: Negative.   Respiratory: Negative.  Negative for shortness of breath.   Cardiovascular: Negative.  Negative for chest pain and leg swelling.  Gastrointestinal: Negative.   Genitourinary: Negative.   Musculoskeletal: Negative.   Skin: Negative.   Neurological: Negative.   Psychiatric/Behavioral: Negative.   All other systems reviewed and are negative.      Objective:   Physical Exam  Constitutional: He appears well-developed and well-nourished.  Cardiovascular: Normal rate and regular rhythm.   Pulmonary/Chest: Effort normal and breath sounds normal.  Skin:  Thickened nails consistent with fungal infection. Recommended dremel which she has to work on reducing the size of the nails.   BP 126/60 mmHg  Pulse 55  Temp(Src) 97.2 F (36.2 C) (Oral)  Ht 6' (1.829 m)  Wt 187 lb 3.2 oz (84.913 kg)  BMI 25.38 kg/m2        Assessment & Plan:  1. Essential hypertension Blood pressure well controlled continue same medicine  2. Heart murmur, systolic Murmurs unchanged. I do not hear transmission to the neck  3. Generalized anxiety disorder Does come control with alprazolam. Will continue  Wardell Honour MD

## 2015-12-11 ENCOUNTER — Other Ambulatory Visit: Payer: Self-pay | Admitting: Family Medicine

## 2015-12-12 NOTE — Telephone Encounter (Signed)
Last given 11/17/15 #90 no refills take TID as needed. If approved please route to Nurse Pool A for nurse to call in.

## 2015-12-18 LAB — HM DIABETES EYE EXAM

## 2015-12-19 ENCOUNTER — Telehealth: Payer: Self-pay | Admitting: Family Medicine

## 2016-01-09 ENCOUNTER — Other Ambulatory Visit: Payer: Self-pay | Admitting: Family Medicine

## 2016-01-10 NOTE — Telephone Encounter (Signed)
Last filled 12/15/15

## 2016-01-11 ENCOUNTER — Ambulatory Visit (INDEPENDENT_AMBULATORY_CARE_PROVIDER_SITE_OTHER): Payer: Medicare Other | Admitting: Family Medicine

## 2016-01-11 ENCOUNTER — Encounter: Payer: Self-pay | Admitting: Family Medicine

## 2016-01-11 VITALS — BP 126/68 | HR 63 | Temp 97.8°F | Ht 72.0 in | Wt 185.0 lb

## 2016-01-11 DIAGNOSIS — J309 Allergic rhinitis, unspecified: Secondary | ICD-10-CM

## 2016-01-11 MED ORDER — FLUTICASONE PROPIONATE 50 MCG/ACT NA SUSP: 1.0000 | Freq: Two times a day (BID) | NASAL | 6 refills | Status: DC | PRN

## 2016-01-11 NOTE — Progress Notes (Signed)
BP 126/68   Pulse 63   Temp 97.8 F (36.6 C) (Oral)   Ht 6' (1.829 m)   Wt 185 lb (83.9 kg)   BMI 25.09 kg/m    Subjective:    Patient ID: Dennis Melton, male    DOB: [birth year], 75 y.o.   MRN: [id number]  HPI: Dennis Melton is a 75 y.o. male presenting on 01/11/2016 for Sinusitis (x 2 weeks, sinus drainage at night, hoarseness) and Cough   HPI Sinus congestion Patient has been having sinus congestion and postnasal drainage is causing him to have a sore throat when he wakes up in the morning that been going on for the past 2-3 weeks. He has tried some Coricidin and it has helped some but not completely. He did not know what else he could use that would not affect his blood pressure. He denies any fevers or chills or shortness of breath or wheezing. He gets a cough in the morning or when he lays down, the cough is mostly nonproductive.  Relevant past medical, surgical, family and social history reviewed and updated as indicated. Interim medical history since our last visit reviewed. Allergies and medications reviewed and updated.  Review of Systems  Constitutional: Negative for chills and fever.  HENT: Positive for congestion, postnasal drip, rhinorrhea, sinus pressure, sneezing and sore throat. Negative for ear discharge, ear pain and voice change.   Eyes: Negative for pain, discharge, redness and visual disturbance.  Respiratory: Positive for cough. Negative for chest tightness, shortness of breath and wheezing.   Cardiovascular: Negative for chest pain and leg swelling.  Musculoskeletal: Negative for gait problem.  Skin: Negative for rash.  All other systems reviewed and are negative.   Per HPI unless specifically indicated above      Objective:    BP 126/68   Pulse 63   Temp 97.8 F (36.6 C) (Oral)   Ht 6' (1.829 m)   Wt 185 lb (83.9 kg)   BMI 25.09 kg/m   Wt Readings from Last 3 Encounters:  01/11/16 185 lb (83.9 kg)  11/17/15 187 lb 3.2 oz (84.9 kg)  09/11/15 192  lb (87.1 kg)    Physical Exam  Constitutional: He is oriented to person, place, and time. He appears well-developed and well-nourished. No distress.  HENT:  Right Ear: Tympanic membrane, external ear and ear canal normal.  Left Ear: Tympanic membrane, external ear and ear canal normal.  Nose: Mucosal edema and rhinorrhea present. No sinus tenderness. No epistaxis. Right sinus exhibits no maxillary sinus tenderness and no frontal sinus tenderness. Left sinus exhibits no maxillary sinus tenderness and no frontal sinus tenderness.  Mouth/Throat: Uvula is midline and mucous membranes are normal. Posterior oropharyngeal edema present. No oropharyngeal exudate, posterior oropharyngeal erythema or tonsillar abscesses.  Eyes: Conjunctivae and EOM are normal. Pupils are equal, round, and reactive to light. Right eye exhibits no discharge. No scleral icterus.  Neck: Neck supple. No thyromegaly present.  Cardiovascular: Normal rate, regular rhythm, normal heart sounds and intact distal pulses.   No murmur heard. Pulmonary/Chest: Effort normal and breath sounds normal. No respiratory distress. He has no wheezes. He has no rales.  Musculoskeletal: Normal range of motion. He exhibits no edema.  Lymphadenopathy:    He has no cervical adenopathy.  Neurological: He is alert and oriented to person, place, and time. Coordination normal.  Skin: Skin is warm and dry. No rash noted. He is not diaphoretic.  Psychiatric: He has a normal mood and affect. His behavior  is normal.  Nursing note and vitals reviewed.     Assessment & Plan:   Problem List Items Addressed This Visit    None    Visit Diagnoses    Allergic rhinitis, unspecified allergic rhinitis type    -  Primary   Use antihistamine and Flonase and Mucinex and nasal saline, should resolve within 3-4 days.   Relevant Medications   fluticasone (FLONASE) 50 MCG/ACT nasal spray       Follow up plan: Return if symptoms worsen or fail to  improve.  Counseling provided for all of the vaccine components No orders of the defined types were placed in this encounter.   Caryl Pina, MD [facility] 01/11/2016, 3:08 PM

## 2016-01-12 NOTE — Telephone Encounter (Signed)
rx called into pharmacy

## 2016-02-12 ENCOUNTER — Other Ambulatory Visit: Payer: Self-pay | Admitting: Family Medicine

## 2016-02-14 ENCOUNTER — Other Ambulatory Visit: Payer: Self-pay | Admitting: *Deleted

## 2016-02-14 ENCOUNTER — Other Ambulatory Visit: Payer: Self-pay | Admitting: Family Medicine

## 2016-02-14 NOTE — Telephone Encounter (Signed)
Closing encounter. Called in from another encounter

## 2016-02-14 NOTE — Telephone Encounter (Signed)
Refill called to [facility] 

## 2016-02-14 NOTE — Telephone Encounter (Signed)
Last seen Dennis Melton - 11/2015 - have nurse phone in

## 2016-02-27 ENCOUNTER — Other Ambulatory Visit: Payer: Self-pay | Admitting: Family Medicine

## 2016-02-27 MED ORDER — ATENOLOL 25 MG PO TABS: ORAL_TABLET | ORAL | 0 refills | Status: DC

## 2016-02-27 NOTE — Telephone Encounter (Signed)
done

## 2016-03-15 ENCOUNTER — Other Ambulatory Visit: Payer: Self-pay | Admitting: Family Medicine

## 2016-03-17 ENCOUNTER — Other Ambulatory Visit: Payer: Self-pay | Admitting: Family Medicine

## 2016-03-19 NOTE — Telephone Encounter (Signed)
Refill called to [facility] 

## 2016-03-30 ENCOUNTER — Other Ambulatory Visit: Payer: Self-pay | Admitting: Family Medicine

## 2016-04-12 ENCOUNTER — Other Ambulatory Visit: Payer: Self-pay | Admitting: Family Medicine

## 2016-04-13 NOTE — Telephone Encounter (Signed)
This can be refilled 1 month and the patient will need to come in to see the provider before any further refills can be done.

## 2016-05-12 ENCOUNTER — Other Ambulatory Visit: Payer: Self-pay | Admitting: Family Medicine

## 2016-05-17 NOTE — Telephone Encounter (Signed)
rx called into [facility] and pt is aware.

## 2016-05-21 NOTE — Progress Notes (Signed)
   Subjective:    Patient ID: Dennis Melton, male    DOB: 06-04-1940, 76 y.o.   MRN: [id number]  HPI 76 year old gentleman here for six-month follow-up for blood pressure. He had previously had issues with his lipids and sugar but lost significant weight in those problems have more or less resolved. We have reviewed his medicines today. He uses alprazolam regularly. He takes amlodipine and valsartan and atenolol for blood pressure. Flomax controls his nocturia. He has no specific complaints or issues today  Patient Active Problem List   Diagnosis Date Noted  . Right ankle pain 01/27/2015  . Generalized anxiety disorder 03/12/2013  . HTN (hypertension) 09/16/2012  . HLD (hyperlipidemia) 09/16/2012  . Heart murmur, systolic 77/82/4235  . Colon polyposis 11/25/2009  . Diverticulosis 11/25/2009   Outpatient Encounter Prescriptions as of 05/22/2016  Medication Sig  . ALPRAZolam (XANAX) 0.25 MG tablet TAKE ONE TABLET BY MOUTH THREE TIMES DAILY AS NEEDED  . amLODipine (NORVASC) 10 MG tablet TAKE 1 TABLET BY MOUTH  DAILY WITH BREAKFAST  . aspirin EC 81 MG tablet Take 81 mg by mouth daily. Reported on 11/17/2015  . Aspirin-Caffeine 845-65 MG PACK Take 1 Package by mouth as needed (Pain).  Marland Kitchen atenolol (TENORMIN) 25 MG tablet TAKE ONE TABLET BY MOUTH ONCE DAILY IN THE MORNING  . cholecalciferol (VITAMIN D) 1000 units tablet Take 1,000 Units by mouth daily.  . citalopram (CELEXA) 20 MG tablet TAKE 1 TABLET BY MOUTH  DAILY  . fluticasone (FLONASE) 50 MCG/ACT nasal spray Place 1 spray into both nostrils 2 (two) times daily as needed for allergies or rhinitis.  Marland Kitchen LORazepam (ATIVAN) 1 MG tablet Take 1 tablet (1 mg total) by mouth 2 (two) times daily as needed for sleep.  . Omega-3 Fatty Acids (FISH OIL) 1200 MG CAPS Take 1 capsule by mouth 2 (two) times daily.  Marland Kitchen omeprazole (PRILOSEC) 20 MG capsule TAKE 1 CAPSULE BY MOUTH 2  TIMES DAILY BEFORE A MEAL.  . tamsulosin (FLOMAX) 0.4 MG CAPS capsule Take 1 capsule  (0.4 mg total) by mouth daily.  . valsartan-hydrochlorothiazide (DIOVAN-HCT) 160-12.5 MG tablet Take 1 tablet by mouth daily.   No facility-administered encounter medications on file as of 05/22/2016.       Review of Systems  Constitutional: Negative.   Respiratory: Negative.   Cardiovascular: Negative.   Genitourinary: Negative.   Neurological: Negative.        Objective:   Physical Exam  Constitutional: He is oriented to person, place, and time. He appears well-developed and well-nourished.  Cardiovascular: Normal rate and regular rhythm.   Murmur heard. Pulmonary/Chest: Effort normal and breath sounds normal.  Neurological: He is alert and oriented to person, place, and time.  Psychiatric: He has a normal mood and affect. His behavior is normal.   BP 128/65   Pulse (!) 54   Temp (!) 96.8 F (36 C) (Oral)   Ht 6' (1.829 m)   Wt 193 lb (87.5 kg)   BMI 26.18 kg/m         Assessment & Plan:  1. Hyperlipidemia, unspecified hyperlipidemia type Lipids were normal when last checked. Had been elevated before he lost weight - Lipid panel  2. Essential hypertension Blood pressure is well controlled on current regimen. There are no side effects - CMP14+EGFR  3. Encounter for immunization Discussed immunizations as relates to flu shot - Flu vaccine HIGH DOSE PF  Wardell Honour MD

## 2016-05-22 ENCOUNTER — Ambulatory Visit (INDEPENDENT_AMBULATORY_CARE_PROVIDER_SITE_OTHER): Payer: Medicare Other | Admitting: Family Medicine

## 2016-05-22 ENCOUNTER — Encounter: Payer: Self-pay | Admitting: Family Medicine

## 2016-05-22 VITALS — BP 128/65 | HR 54 | Temp 96.8°F | Ht 72.0 in | Wt 193.0 lb

## 2016-05-22 DIAGNOSIS — I10 Essential (primary) hypertension: Secondary | ICD-10-CM

## 2016-05-22 DIAGNOSIS — E785 Hyperlipidemia, unspecified: Secondary | ICD-10-CM | POA: Diagnosis not present

## 2016-05-22 DIAGNOSIS — Z23 Encounter for immunization: Secondary | ICD-10-CM | POA: Diagnosis not present

## 2016-05-22 LAB — LIPID PANEL
CHOL/HDL RATIO: 3.6 ratio (ref 0.0–5.0)
CHOLESTEROL TOTAL: 130 mg/dL (ref 100–199)
HDL: 36 mg/dL — AB (ref >39)
LDL CALC: 79 mg/dL (ref 0–99)
TRIGLYCERIDES: 76 mg/dL (ref 0–149)
VLDL Cholesterol Cal: 15 mg/dL (ref 5–40)

## 2016-05-22 LAB — CMP14+EGFR
ALT: 13 IU/L (ref 0–44)
AST: 16 IU/L (ref 0–40)
Albumin/Globulin Ratio: 1.7 (ref 1.2–2.2)
Albumin: 4.3 g/dL (ref 3.5–4.8)
Alkaline Phosphatase: 98 IU/L (ref 39–117)
BUN/Creatinine Ratio: 13 (ref 10–24)
BUN: 13 mg/dL (ref 8–27)
Bilirubin Total: 0.4 mg/dL (ref 0.0–1.2)
CALCIUM: 8.9 mg/dL (ref 8.6–10.2)
CO2: 23 mmol/L (ref 18–29)
CREATININE: 1.01 mg/dL (ref 0.76–1.27)
Chloride: 98 mmol/L (ref 96–106)
GFR calc Af Amer: 84 mL/min/{1.73_m2} (ref >59)
GFR, EST NON AFRICAN AMERICAN: 73 mL/min/{1.73_m2} (ref >59)
GLUCOSE: 102 mg/dL — AB (ref 65–99)
Globulin, Total: 2.6 g/dL (ref 1.5–4.5)
Potassium: 3.8 mmol/L (ref 3.5–5.2)
Sodium: 139 mmol/L (ref 134–144)
Total Protein: 6.9 g/dL (ref 6.0–8.5)

## 2016-05-22 MED ORDER — ATENOLOL 25 MG PO TABS: ORAL_TABLET | ORAL | 1 refills | Status: DC

## 2016-06-12 ENCOUNTER — Other Ambulatory Visit: Payer: Self-pay | Admitting: Family Medicine

## 2016-06-13 ENCOUNTER — Other Ambulatory Visit: Payer: Self-pay | Admitting: Family Medicine

## 2016-08-10 ENCOUNTER — Other Ambulatory Visit: Payer: Self-pay | Admitting: Family Medicine

## 2016-08-13 NOTE — Telephone Encounter (Signed)
Xanax called  To pharmacy with message ,patient needs to be seen.

## 2016-09-11 ENCOUNTER — Other Ambulatory Visit: Payer: Self-pay | Admitting: Family Medicine

## 2016-10-09 ENCOUNTER — Other Ambulatory Visit: Payer: Self-pay | Admitting: Family Medicine

## 2016-10-10 NOTE — Telephone Encounter (Signed)
Last filled 09/10/16, last seen by Sabra Heck 06/01/16. Has appt with you on 11/19/16. Route to pool

## 2016-10-11 NOTE — Telephone Encounter (Signed)
Phoned in.

## 2016-11-05 ENCOUNTER — Other Ambulatory Visit: Payer: Self-pay | Admitting: Family Medicine

## 2016-11-05 NOTE — Telephone Encounter (Signed)
Last seen 05/22/16  Dr Sabra Heck  If approved route to nurse to call into [facility]

## 2016-11-06 NOTE — Telephone Encounter (Signed)
Refill for xanax sent, needs to be seen to establish with me.   [name], MD [facility] 11/06/2016, 12:17 PM

## 2016-11-06 NOTE — Telephone Encounter (Signed)
Rx called into pharmacy and patient has appointment on 07/09

## 2016-11-19 ENCOUNTER — Ambulatory Visit (INDEPENDENT_AMBULATORY_CARE_PROVIDER_SITE_OTHER): Payer: Medicare Other | Admitting: Family Medicine

## 2016-11-19 ENCOUNTER — Encounter: Payer: Self-pay | Admitting: Family Medicine

## 2016-11-19 VITALS — BP 133/67 | HR 56 | Temp 97.1°F | Ht 73.0 in | Wt 198.0 lb

## 2016-11-19 DIAGNOSIS — F411 Generalized anxiety disorder: Secondary | ICD-10-CM

## 2016-11-19 DIAGNOSIS — I10 Essential (primary) hypertension: Secondary | ICD-10-CM

## 2016-11-19 DIAGNOSIS — N401 Enlarged prostate with lower urinary tract symptoms: Secondary | ICD-10-CM

## 2016-11-19 MED ORDER — ALPRAZOLAM 0.25 MG PO TABS: 0.2500 mg | ORAL_TABLET | Freq: Three times a day (TID) | ORAL | 2 refills | Status: DC | PRN

## 2016-11-19 NOTE — Progress Notes (Signed)
   HPI  Patient presents today here to follow-up for chronic medical conditions.  Patient requests urology referral, he has BPH and his previous urologist has retired. He would like to avoid [place] possible.  Hypertension Good medication compliance, no chest pain, dyspnea, palpitations, leg edema.  Anxiety Well-controlled with Xanax plus citalopram, does not need a refill, previously was seen every 6 months.   PMH: Smoking status noted ROS: Per HPI  Objective: BP 133/67   Pulse (!) 56   Temp (!) 97.1 F (36.2 C) (Oral)   Ht 6\' 1"  (1.854 m)   Wt 198 lb (89.8 kg)   BMI 26.12 kg/m  Gen: NAD, alert, cooperative with exam HEENT: NCAT, EOMI, PERRL CV: RRR, good S1/S2, no murmur Resp: CTABL, no wheezes, non-labored Ext: No edema, warm Neuro: Alert and oriented, No gross deficits  Assessment and plan:  # BPH Long-term, patient has been seeing urology consistently for years, his urologist has recently retired and he would like another referral. 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 Labs are up-to-date. No renal disease, annual labs planned  # GAD. Well-controlled on Xanax plus citalopram Discussed every 3 month appointment. Refilled 3 months, recently has had refill over the phone. [place] controlled substance database reviewed with no red flags    Orders Placed This Encounter  Procedures  . Ambulatory referral to Urology    Referral Priority:   Routine    Referral Type:   Consultation    Referral Reason:   Specialty Services Required    Requested Specialty:   Urology    Number of Visits Requested:   1    Meds ordered this encounter  Medications  . ALPRAZolam (XANAX) 0.25 MG tablet    Sig: Take 1 tablet (0.25 mg total) by mouth 3 (three) times daily as needed.    Dispense:  90 tablet    Refill:  [place], MD [facility] 11/19/2016, 9:54 AM

## 2016-11-19 NOTE — Patient Instructions (Signed)
Great to see you!  Come back in 3 months unless you need [place] sooner.   You will be called to schedule an appointment with a  Urologist.

## 2016-12-03 ENCOUNTER — Other Ambulatory Visit: Payer: Self-pay | Admitting: Family Medicine

## 2017-02-21 ENCOUNTER — Ambulatory Visit (INDEPENDENT_AMBULATORY_CARE_PROVIDER_SITE_OTHER): Payer: Medicare Other | Admitting: Family Medicine

## 2017-02-21 ENCOUNTER — Encounter: Payer: Self-pay | Admitting: Family Medicine

## 2017-02-21 VITALS — BP 127/66 | HR 58 | Temp 98.0°F | Ht 73.0 in | Wt 201.8 lb

## 2017-02-21 DIAGNOSIS — Z7689 Persons encountering health services in other specified circumstances: Secondary | ICD-10-CM | POA: Diagnosis not present

## 2017-02-21 DIAGNOSIS — F411 Generalized anxiety disorder: Secondary | ICD-10-CM

## 2017-02-21 DIAGNOSIS — I10 Essential (primary) hypertension: Secondary | ICD-10-CM | POA: Diagnosis not present

## 2017-02-21 DIAGNOSIS — R7309 Other abnormal glucose: Secondary | ICD-10-CM | POA: Diagnosis not present

## 2017-02-21 DIAGNOSIS — Z23 Encounter for immunization: Secondary | ICD-10-CM | POA: Diagnosis not present

## 2017-02-21 DIAGNOSIS — E785 Hyperlipidemia, unspecified: Secondary | ICD-10-CM

## 2017-02-21 MED ORDER — ALPRAZOLAM 0.25 MG PO TABS: 0.2500 mg | ORAL_TABLET | Freq: Three times a day (TID) | ORAL | 2 refills | Status: DC | PRN

## 2017-02-21 NOTE — Progress Notes (Signed)
   HPI  Patient presents today here to follow-up for chronic medical conditions, anxiety, depression, hypertension, hyperlipidemia.  Good medication compliance, patient had more problems with depression lately. Denies SI. Notes that he's had some family stressors. Xanax is working well, uses it 0-3 times daily, mostly 3 times daily.  Hypertension No chest pain or headaches. No leg swelling.  Hyperlipidemia Fasting today, taking fish oil, however does not want to take more medications unless absolutely needed.  PMH: Smoking status noted ROS: Per HPI  Objective: BP 127/66   Pulse (!) 58   Temp 98 F (36.7 C) (Oral)   Ht '6\' 1"'$  (1.854 m)   Wt 201 lb 12.8 oz (91.5 kg)   BMI 26.62 kg/m  Gen: NAD, alert, cooperative with exam HEENT: NCAT CV: RRR, good E1/R8, 3/6 systolic murmur Resp: CTABL, no wheezes, non-labored Ext: No edema, warm Neuro: Alert and oriented, No gross deficits Skin:  Solar lentigo 2 lesions on the left bicep, solar lentigo on the right dorsal hand as well  Assessment and plan:  # Hypertension Well controlled on [place], patient will call in investigate whether or not his particular valsartan was recalled. Continue atenolol and amlodipine as well.  # GAD Well-controlled with Xanax plus Celexa, patient has had more depression lately. Offered medication change, he will consider and let me know if getting worse or not improving  # Hyperlipidemia Continue current fistula, at the age of 64 likely will not start any statins Fasting lipid panel today   Need for immunization-counseling provided for all exiting components   Orders Placed This Encounter  Procedures  . CMP14+EGFR  . CBC with Differential/Platelet  . Lipid panel  . TSH    Meds ordered this encounter  Medications  . ALPRAZolam (XANAX) 0.25 MG tablet    Sig: Take 1 tablet (0.25 mg total) by mouth 3 (three) times daily as needed.    Dispense:  90 tablet    Refill:  [place],  MD [facility] 02/21/2017, 8:49 AM

## 2017-02-21 NOTE — Patient Instructions (Signed)
Great to see you!  Come back in 3 months unless you need us sooner.    

## 2017-02-22 LAB — CMP14+EGFR
ALBUMIN: 4.6 g/dL (ref 3.5–4.8)
ALK PHOS: 114 IU/L (ref 39–117)
ALT: 24 IU/L (ref 0–44)
AST: 25 IU/L (ref 0–40)
Albumin/Globulin Ratio: 1.5 (ref 1.2–2.2)
BILIRUBIN TOTAL: 0.5 mg/dL (ref 0.0–1.2)
BUN / CREAT RATIO: 7 — AB (ref 10–24)
BUN: 8 mg/dL (ref 8–27)
CHLORIDE: 97 mmol/L (ref 96–106)
CO2: 24 mmol/L (ref 20–29)
Calcium: 9.2 mg/dL (ref 8.6–10.2)
Creatinine, Ser: 1.12 mg/dL (ref 0.76–1.27)
GFR calc Af Amer: 74 mL/min/{1.73_m2} (ref >59)
GFR calc non Af Amer: 64 mL/min/{1.73_m2} (ref >59)
GLOBULIN, TOTAL: 3 g/dL (ref 1.5–4.5)
GLUCOSE: 115 mg/dL — AB (ref 65–99)
Potassium: 4.2 mmol/L (ref 3.5–5.2)
SODIUM: 137 mmol/L (ref 134–144)
Total Protein: 7.6 g/dL (ref 6.0–8.5)

## 2017-02-22 LAB — CBC WITH DIFFERENTIAL/PLATELET
BASOS ABS: 0.1 10*3/uL (ref 0.0–0.2)
Basos: 1 %
EOS (ABSOLUTE): 0.2 10*3/uL (ref 0.0–0.4)
Eos: 4 %
Hematocrit: 44.2 % (ref 37.5–51.0)
Hemoglobin: 15 g/dL (ref 13.0–17.7)
IMMATURE GRANULOCYTES: 0 %
Immature Grans (Abs): 0 10*3/uL (ref 0.0–0.1)
LYMPHS ABS: 1.2 10*3/uL (ref 0.7–3.1)
Lymphs: 26 %
MCH: 30.5 pg (ref 26.6–33.0)
MCHC: 33.9 g/dL (ref 31.5–35.7)
MCV: 90 fL (ref 79–97)
Monocytes Absolute: 0.4 10*3/uL (ref 0.1–0.9)
Monocytes: 9 %
NEUTROS PCT: 60 %
Neutrophils Absolute: 2.9 10*3/uL (ref 1.4–7.0)
PLATELETS: 127 10*3/uL — AB (ref 150–379)
RBC: 4.92 x10E6/uL (ref 4.14–5.80)
RDW: 15.5 % — AB (ref 12.3–15.4)
WBC: 4.8 10*3/uL (ref 3.4–10.8)

## 2017-02-22 LAB — LIPID PANEL
Chol/HDL Ratio: 4.3 ratio (ref 0.0–5.0)
Cholesterol, Total: 143 mg/dL (ref 100–199)
HDL: 33 mg/dL — AB (ref >39)
LDL Calculated: 78 mg/dL (ref 0–99)
TRIGLYCERIDES: 158 mg/dL — AB (ref 0–149)
VLDL Cholesterol Cal: 32 mg/dL (ref 5–40)

## 2017-02-22 LAB — TSH: TSH: 1.33 u[IU]/mL (ref 0.450–4.500)

## 2017-02-26 LAB — SPECIMEN STATUS REPORT

## 2017-02-26 LAB — HGB A1C W/O EAG: Hgb A1c MFr Bld: 5.5 % (ref 4.8–5.6)

## 2017-03-13 ENCOUNTER — Other Ambulatory Visit: Payer: Self-pay | Admitting: Family

## 2017-03-13 ENCOUNTER — Other Ambulatory Visit: Payer: Self-pay | Admitting: Family Medicine

## 2017-03-29 ENCOUNTER — Ambulatory Visit: Payer: Medicare Other | Admitting: Urology

## 2017-03-29 DIAGNOSIS — N401 Enlarged prostate with lower urinary tract symptoms: Secondary | ICD-10-CM

## 2017-03-29 DIAGNOSIS — N5201 Erectile dysfunction due to arterial insufficiency: Secondary | ICD-10-CM

## 2017-03-29 DIAGNOSIS — R351 Nocturia: Secondary | ICD-10-CM

## 2017-05-09 ENCOUNTER — Ambulatory Visit (INDEPENDENT_AMBULATORY_CARE_PROVIDER_SITE_OTHER): Payer: Medicare Other | Admitting: *Deleted

## 2017-05-09 VITALS — BP 112/58 | HR 57 | Temp 97.7°F | Ht 73.0 in | Wt 211.4 lb

## 2017-05-09 DIAGNOSIS — Z0000 Encounter for general adult medical examination without abnormal findings: Secondary | ICD-10-CM | POA: Diagnosis not present

## 2017-05-09 NOTE — Progress Notes (Signed)
Subjective:   Dennis Melton is a 76 y.o. male who presents for an Initial Medicare Annual Wellness Visit. 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 He enjoys fishing, watching basketball and baseball. He does not exercise. He states his diet is semi-healthy. Patient is not involved with the community or church. He lives in [place] with a significant other. He has one adult daughter. He does not have any pets, or stairs in the home. Patient does have small rugs in the home and he states that they are slip resistance bottoms. Fall hazards were discussed with patient. He states that his health is about the same as it was last year at this time. He has not had any hospitalizations or surgeries in the last year.  Review of Systems   Cardiac Risk Factors include: hypertension;advanced age (>56men, >10 women);smoking/ tobacco exposure    Objective:    Today's Vitals   05/09/17 1455  BP: (!) 112/58  Pulse: (!) 57  Temp: 97.7 F (36.5 C)  TempSrc: Oral  Weight: 211 lb 6.4 oz (95.9 kg)  Height: 6\' 1"  (1.854 m)   Body mass index is 27.89 kg/m.  Advanced Directives 05/09/2017 09/11/2015 02/04/2015 06/09/2013  Does Patient Have a Medical Advance Directive? No No No Patient does not have advance directive;Patient would not like information  Would patient like information on creating a medical advance directive? Yes (MAU/Ambulatory/Procedural Areas - Information given) No - patient declined information No - patient declined information -  Pre-existing out of facility DNR order (yellow form or pink MOST form) - - - No    Current Medications (verified) Outpatient Encounter Medications as of 05/09/2017  Medication Sig  . ALPRAZolam (XANAX) 0.25 MG tablet Take 1 tablet (0.25 mg total) by mouth 3 (three) times daily as needed.  Marland Kitchen amLODipine (NORVASC) 10 MG tablet TAKE 1 TABLET BY MOUTH  DAILY WITH BREAKFAST  . aspirin EC 81 MG tablet Take 81 mg by mouth daily. Reported on 11/17/2015  .  Aspirin-Caffeine 845-65 MG PACK Take 1 Package by mouth as needed (Pain).  Marland Kitchen atenolol (TENORMIN) 25 MG tablet TAKE 1 TABLET BY MOUTH ONCE DAILY IN THE MORNING  . cholecalciferol (VITAMIN D) 1000 units tablet Take 1,000 Units by mouth daily.  . citalopram (CELEXA) 20 MG tablet TAKE 1 TABLET BY MOUTH  DAILY  . Omega-3 Fatty Acids (FISH OIL) 1200 MG CAPS Take 1 capsule by mouth 2 (two) times daily.  Marland Kitchen omeprazole (PRILOSEC) 20 MG capsule TAKE 1 CAPSULE BY MOUTH 2  TIMES DAILY BEFORE A MEAL.  . tamsulosin (FLOMAX) 0.4 MG CAPS capsule TAKE 1 CAPSULE BY MOUTH  DAILY  . valsartan-hydrochlorothiazide (DIOVAN-HCT) 160-12.5 MG tablet TAKE 1 TABLET BY MOUTH  DAILY   No facility-administered encounter medications on file as of 05/09/2017.     Allergies (verified) Patient has no known allergies.   History: Past Medical History:  Diagnosis Date  . Anxiety   . Depression   . High cholesterol   . Hypertension    Past Surgical History:  Procedure Laterality Date  . BACK SURGERY     lumbar laminectomy  . CATARACT EXTRACTION W/PHACO Left 06/18/2013   Procedure: CATARACT EXTRACTION PHACO AND INTRAOCULAR LENS PLACEMENT (IOC);  Surgeon: Tonny Branch, MD;  Location: [facility];  Service: Ophthalmology;  Laterality: Left;  CDE:12.51   Family History  Problem Relation Age of Onset  . Diabetes Father   . Anxiety disorder Brother   . Heart disease Brother    Social  History   Socioeconomic History  . Marital status: Divorced    Spouse name: None  . Number of children: None  . Years of education: None  . Highest education level: None  Social Needs  . Financial resource strain: Not hard at all  . Food insecurity - worry: Never true  . Food insecurity - inability: Never true  . Transportation needs - medical: No  . Transportation needs - non-medical: No  Occupational History  . None  Tobacco Use  . Smoking status: Current Some Day Smoker    Packs/day: 2.00    Years: 30.00    Pack years: 60.00     Types: Cigars  . Smokeless tobacco: Never Used  Substance and Sexual Activity  . Alcohol use: Yes    Comment: occassionally  . Drug use: No  . Sexual activity: Yes    Birth control/protection: None  Other Topics Concern  . None  Social History Narrative  . None   Tobacco Counseling Ready to quit: No Counseling given: Not Answered Patient does smoke cigars and is not ready to quit.    Activities of Daily Living In your present state of health, do you have any difficulty performing the following activities: 05/09/2017  Hearing? N  Vision? N  Comment wears glasses only when driving   Difficulty concentrating or making decisions? Y  Comment Patient states he has trouble with short term memory  Walking or climbing stairs? N  Dressing or bathing? N  Doing errands, shopping? N  Preparing Food and eating ? N  Using the Toilet? N  In the past six months, have you accidently leaked urine? N  Do you have problems with loss of bowel control? N  Managing your Medications? N  Managing your Finances? N  Housekeeping or managing your Housekeeping? N  Some recent data might be hidden  Patient does wear glasses to drive  Patient states that he does have trouble with short term memory loss.   Immunizations and Health Maintenance Immunization History  Administered Date(s) Administered  . Influenza, High Dose Seasonal PF 05/22/2016, 02/21/2017  . Influenza,inj,Quad PF,6+ Mos 03/20/2013, 03/17/2014, 02/09/2015  . Pneumococcal Conjugate-13 11/17/2015  . Pneumococcal Polysaccharide-23 01/12/2010  . Tdap 11/03/2013  . Zoster 11/20/2010   There are no preventive care reminders to display for this patient.  Patient Care Team: Timmothy Euler, MD as PCP - General (Family Medicine)  Indicate any recent Medical Services you may have received from other than [facility] providers in the past year (date may be approximate).    Assessment:   This is a routine wellness examination for  [place].  Hearing/Vision screen No exam data present Patient does not have any concerns with his vision or hearing.  Dietary issues and exercise activities discussed: Current Exercise Habits: The patient does not participate in regular exercise at present  Goals    . Exercise 3x per week (30 min per time)      Depression Screen PHQ 2/9 Scores 05/09/2017 02/21/2017 11/19/2016 05/22/2016  PHQ - 2 Score 1 1 0 0    Fall Risk Fall Risk  05/09/2017 02/21/2017 11/19/2016 01/11/2016 11/17/2015  Falls in the past year? No No No Yes Yes  Number falls in past yr: - - - 1 1  Injury with Fall? - - - Yes Yes  Comment - - - fractured ankle fractured ankle    Is the patient's home free of loose throw rugs in walkways, pet beds, electrical cords, etc?   yes  Adequate lighting?   yes  Cognitive Function: MMSE - Mini Mental State Exam 05/09/2017  Orientation to time 5  Orientation to Place 5  Registration 3  Attention/ Calculation 5  Recall 1  Language- name 2 objects 2  Language- repeat 1  Language- follow 3 step command 3  Language- read & follow direction 1  Write a sentence 1  Copy design 1  Total score 28  Patient scored a 28 out of 30      Screening Tests Health Maintenance  Topic Date Due  . COLONOSCOPY  11/27/2017  . TETANUS/TDAP  11/04/2023  . INFLUENZA VACCINE  Completed  . PNA vac Low Risk Adult  Completed    Qualifies for Shingles Vaccine? Yes, patient did not want to get today and states she will discuss at his next visit with Wendi Snipes.   Additional Screenings: Hepatitis B/HIV/Syphillis: Hepatitis C Screening:       Plan:  Patient to follow up with Wendi Snipes in January Patient will discuss Shingrix at his next visit Patient will call and schedule eye exam Patient is due for colonoscopy in 11/2017  I have personally reviewed and noted the following in the patient's chart:   . Medical and social history . Use of alcohol, tobacco or illicit drugs  . Current  medications and supplements . Functional ability and status . Nutritional status . Physical activity . Advanced directives . List of other physicians . Hospitalizations, surgeries, and ER visits in previous 12 months . Vitals . Screenings to include cognitive, depression, and falls . Referrals and appointments  In addition, I have reviewed and discussed with patient certain preventive protocols, quality metrics, and best practice recommendations. A written personalized care plan for preventive services as well as general preventive health recommendations were provided to patient.     Gareth Morgan, LPN   [id number]/76/[id number]    I have reviewed and agree with the above AWV documentation.   Evelina Dun, FNP

## 2017-05-09 NOTE — Patient Instructions (Signed)
  Dennis Melton , Thank you for taking time to come for your Medicare Wellness Visit. I appreciate your ongoing commitment to your health goals. Please review the following plan we discussed and let me know if I can assist you in the future.   These are the goals we discussed: Goals    . Exercise 3x per week (30 min per time)       This is a list of the screening recommended for you and due dates:  Health Maintenance  Topic Date Due  . Colon Cancer Screening  11/27/2017  . Tetanus Vaccine  11/04/2023  . Flu Shot  Completed  . Pneumonia vaccines  Completed   Please call and schedule eye exam

## 2017-05-24 ENCOUNTER — Ambulatory Visit (INDEPENDENT_AMBULATORY_CARE_PROVIDER_SITE_OTHER): Payer: Medicare Other | Admitting: Family Medicine

## 2017-05-24 ENCOUNTER — Encounter: Payer: Self-pay | Admitting: Family Medicine

## 2017-05-24 VITALS — BP 133/67 | HR 61 | Temp 96.9°F | Ht 73.0 in | Wt 208.8 lb

## 2017-05-24 DIAGNOSIS — F411 Generalized anxiety disorder: Secondary | ICD-10-CM

## 2017-05-24 DIAGNOSIS — I10 Essential (primary) hypertension: Secondary | ICD-10-CM

## 2017-05-24 MED ORDER — ALPRAZOLAM 0.25 MG PO TABS: 0.2500 mg | ORAL_TABLET | Freq: Three times a day (TID) | ORAL | 2 refills | Status: DC | PRN

## 2017-05-24 NOTE — Progress Notes (Signed)
   HPI  Patient presents today for follow-up of anxiety and hypertension.  Anxiety Doing well with Xanax plus citalopram.  He does have some depression he states center around his finances.  He feels that his symptoms are well controlled with medication. He needs refill. No dizziness or unsteadiness with Xanax.  Hypertension Good medication compliance, we discussed the valsartan recall  PMH: Smoking status noted ROS: Per HPI  Objective: BP 133/67   Pulse 61   Temp (!) 96.9 F (36.1 C) (Oral)   Ht 6\' 1"  (1.854 m)   Wt 208 lb 12.8 oz (94.7 kg)   BMI 27.55 kg/m  Gen: NAD, alert, cooperative with exam HEENT: NCAT CV: RRR, good S1/S2, no murmur Resp: CTABL, no wheezes, non-labored Ext: No edema, warm Neuro: Alert and oriented, No gross deficits  Assessment and plan:  # GAD Controlled, refilled xanax  # HTN Controlled, no chnage Labs utd  Meds ordered this encounter  Medications  . ALPRAZolam (XANAX) 0.25 MG tablet    Sig: Take 1 tablet (0.25 mg total) by mouth 3 (three) times daily as needed.    Dispense:  90 tablet    Refill:  [place], MD [facility] 05/24/2017, 8:16 AM

## 2017-05-24 NOTE — Patient Instructions (Signed)
Great to see you!  Come back in 3 months unless you need us sooner.    

## 2017-05-28 ENCOUNTER — Other Ambulatory Visit: Payer: Self-pay | Admitting: Family

## 2017-05-28 ENCOUNTER — Other Ambulatory Visit: Payer: Self-pay | Admitting: Family Medicine

## 2017-07-30 ENCOUNTER — Other Ambulatory Visit: Payer: Self-pay | Admitting: Family

## 2017-07-30 ENCOUNTER — Other Ambulatory Visit: Payer: Self-pay | Admitting: Family Medicine

## 2017-08-23 ENCOUNTER — Encounter: Payer: Self-pay | Admitting: Family Medicine

## 2017-08-23 ENCOUNTER — Ambulatory Visit (INDEPENDENT_AMBULATORY_CARE_PROVIDER_SITE_OTHER): Payer: Medicare Other | Admitting: Family Medicine

## 2017-08-23 VITALS — BP 126/66 | HR 56 | Temp 97.0°F | Ht 73.0 in | Wt 198.0 lb

## 2017-08-23 DIAGNOSIS — I10 Essential (primary) hypertension: Secondary | ICD-10-CM | POA: Diagnosis not present

## 2017-08-23 DIAGNOSIS — E119 Type 2 diabetes mellitus without complications: Secondary | ICD-10-CM | POA: Diagnosis not present

## 2017-08-23 DIAGNOSIS — Z720 Tobacco use: Secondary | ICD-10-CM | POA: Diagnosis not present

## 2017-08-23 MED ORDER — METFORMIN HCL 500 MG PO TABS: 500.0000 mg | ORAL_TABLET | Freq: Two times a day (BID) | ORAL | 3 refills | Status: DC

## 2017-08-23 MED ORDER — ALPRAZOLAM 0.25 MG PO TABS: 0.2500 mg | ORAL_TABLET | Freq: Three times a day (TID) | ORAL | 2 refills | Status: DC | PRN

## 2017-08-23 NOTE — Patient Instructions (Signed)
Great to see you!  Come back in 3 months unless you need us sooner.    

## 2017-08-23 NOTE — Progress Notes (Signed)
   HPI  Patient presents today for follow-up chronic medical conditions  Type 2 diabetes A1c was found to be 7.3 on February 12 by home health nurse. Patient has done well on metformin previously.   He is also interested in ultrasound of the abdomen, however he needs to space things out due to cost -aneurysm screening   patient would like to have lung cancer screening He reports smoking 6 cigarillos for 30 years, previous to that 1 pack/day for 20 years.  Hypertension Good medication compliance, no chest pain.  PMH: Smoking status noted ROS: Per HPI  Objective: BP 126/66 (BP Location: Left Arm)   Pulse (!) 56   Temp (!) 97 F (36.1 C) (Oral)   Ht '6\' 1"'$  (1.854 m)   Wt 198 lb (89.8 kg)   BMI 26.12 kg/m  Gen: NAD, alert, cooperative with exam HEENT: NCAT CV: RRR, good S1/S2, no murmur Resp: CTABL, no wheezes, non-labored Abd: SNTND, BS present, no guarding or organomegaly Ext: No edema, warm Neuro: Alert and oriented, No gross deficits  Assessment and plan:  #Type 2 diabetes Previously diet controlled after reviewing his history. Restart metformin A1c 7.2, repeat in 3 months  #Tobacco use Patient with greater than 30-pack-year history, low-dose CT scanning ordered  #Hypertension Well-controlled No changes Labs     Orders Placed This Encounter  Procedures  . CT CHEST LUNG CA SCREEN LOW DOSE W/O CM    Standing Status:   Future    Standing Expiration Date:   10/24/2018    Order Specific Question:   Reason for Exam (SYMPTOM  OR DIAGNOSIS REQUIRED)    Answer:   screening    Order Specific Question:   Preferred Imaging Location?    Answer:   [facility]    Order Specific Question:   Radiology Contrast Protocol - do NOT remove file path    Answer:   [email or web address]  . CMP14+EGFR  . CBC with Differential/Platelet    Meds ordered this encounter  Medications  . metFORMIN (GLUCOPHAGE) 500 MG tablet    Sig: Take 1 tablet  (500 mg total) by mouth 2 (two) times daily with a meal.    Dispense:  180 tablet    Refill:  3  . ALPRAZolam (XANAX) 0.25 MG tablet    Sig: Take 1 tablet (0.25 mg total) by mouth 3 (three) times daily as needed.    Dispense:  90 tablet    Refill:  [place], MD [facility] 08/23/2017, 8:20 AM

## 2017-08-24 LAB — CBC WITH DIFFERENTIAL/PLATELET
BASOS ABS: 0 10*3/uL (ref 0.0–0.2)
Basos: 1 %
EOS (ABSOLUTE): 0.2 10*3/uL (ref 0.0–0.4)
Eos: 4 %
Hematocrit: 42.4 % (ref 37.5–51.0)
Hemoglobin: 14.6 g/dL (ref 13.0–17.7)
IMMATURE GRANS (ABS): 0 10*3/uL (ref 0.0–0.1)
Immature Granulocytes: 0 %
LYMPHS: 26 %
Lymphocytes Absolute: 1 10*3/uL (ref 0.7–3.1)
MCH: 30.5 pg (ref 26.6–33.0)
MCHC: 34.4 g/dL (ref 31.5–35.7)
MCV: 89 fL (ref 79–97)
Monocytes Absolute: 0.5 10*3/uL (ref 0.1–0.9)
Monocytes: 13 %
NEUTROS ABS: 2.2 10*3/uL (ref 1.4–7.0)
Neutrophils: 56 %
PLATELETS: 144 10*3/uL — AB (ref 150–379)
RBC: 4.79 x10E6/uL (ref 4.14–5.80)
RDW: 15 % (ref 12.3–15.4)
WBC: 4 10*3/uL (ref 3.4–10.8)

## 2017-08-24 LAB — CMP14+EGFR
A/G RATIO: 1.4 (ref 1.2–2.2)
ALK PHOS: 134 IU/L — AB (ref 39–117)
ALT: 28 IU/L (ref 0–44)
AST: 38 IU/L (ref 0–40)
Albumin: 4.4 g/dL (ref 3.5–4.8)
BUN/Creatinine Ratio: 9 — ABNORMAL LOW (ref 10–24)
BUN: 9 mg/dL (ref 8–27)
Bilirubin Total: 0.7 mg/dL (ref 0.0–1.2)
CHLORIDE: 98 mmol/L (ref 96–106)
CO2: 21 mmol/L (ref 20–29)
Calcium: 9 mg/dL (ref 8.6–10.2)
Creatinine, Ser: 1.03 mg/dL (ref 0.76–1.27)
GFR calc Af Amer: 82 mL/min/{1.73_m2} (ref >59)
GFR calc non Af Amer: 71 mL/min/{1.73_m2} (ref >59)
GLUCOSE: 112 mg/dL — AB (ref 65–99)
Globulin, Total: 3.2 g/dL (ref 1.5–4.5)
POTASSIUM: 4.2 mmol/L (ref 3.5–5.2)
Sodium: 138 mmol/L (ref 134–144)
Total Protein: 7.6 g/dL (ref 6.0–8.5)

## 2017-09-09 ENCOUNTER — Other Ambulatory Visit: Payer: Self-pay | Admitting: *Deleted

## 2017-09-09 MED ORDER — METFORMIN HCL 500 MG PO TABS: 500.0000 mg | ORAL_TABLET | Freq: Two times a day (BID) | ORAL | 0 refills | Status: DC

## 2017-09-10 ENCOUNTER — Ambulatory Visit (HOSPITAL_COMMUNITY)
Admission: RE | Admit: 2017-09-10 | Discharge: 2017-09-10 | Disposition: A | Discharge status: Home / Self Care | Payer: Medicare Other | Source: Ambulatory Visit | Attending: Family Medicine | Admitting: Family Medicine

## 2017-09-10 DIAGNOSIS — Z720 Tobacco use: Secondary | ICD-10-CM | POA: Diagnosis not present

## 2017-09-10 DIAGNOSIS — I700 Atherosclerosis of aorta: Secondary | ICD-10-CM | POA: Diagnosis not present

## 2017-09-10 DIAGNOSIS — I77810 Thoracic aortic ectasia: Secondary | ICD-10-CM | POA: Diagnosis not present

## 2017-09-10 DIAGNOSIS — J439 Emphysema, unspecified: Secondary | ICD-10-CM | POA: Diagnosis not present

## 2017-09-10 DIAGNOSIS — Z122 Encounter for screening for malignant neoplasm of respiratory organs: Secondary | ICD-10-CM | POA: Insufficient documentation

## 2017-09-10 DIAGNOSIS — K760 Fatty (change of) liver, not elsewhere classified: Secondary | ICD-10-CM | POA: Insufficient documentation

## 2017-09-10 DIAGNOSIS — I2510 Atherosclerotic heart disease of native coronary artery without angina pectoris: Secondary | ICD-10-CM | POA: Diagnosis not present

## 2017-09-11 ENCOUNTER — Telehealth: Payer: Self-pay | Admitting: Family Medicine

## 2017-09-11 NOTE — Telephone Encounter (Signed)
Wanting CT result from yesterday. Aware that Dr. Wendi Snipes is out of the office and will review it tomorrow when he is in the office.

## 2017-09-12 NOTE — Telephone Encounter (Signed)
Patient aware and verbalizes understanding. 

## 2017-09-12 NOTE — Telephone Encounter (Signed)
Pt calling in for results-   CT chest with screening for lung cancer. Patient has very small nodules that do not look concerning, however considering his history of smoking etiology recommends annual CT scanning to continue following.  He shows signs of fatty liver as well as coronary artery disease.   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  We will be able to follow-up his annual CT scanning and the widening of the aorta all at once with a CT angiogram.  [name], MD [facility] 09/12/2017, 7:45 AM

## 2017-10-26 ENCOUNTER — Other Ambulatory Visit: Payer: Self-pay | Admitting: Family Medicine

## 2017-11-20 ENCOUNTER — Other Ambulatory Visit: Payer: Self-pay | Admitting: Family Medicine

## 2017-11-20 NOTE — Telephone Encounter (Signed)
Last seen 08/23/17  Dr Wendi Snipes

## 2017-11-25 ENCOUNTER — Ambulatory Visit (INDEPENDENT_AMBULATORY_CARE_PROVIDER_SITE_OTHER): Payer: Medicare Other | Admitting: Family Medicine

## 2017-11-25 ENCOUNTER — Encounter: Payer: Self-pay | Admitting: Family Medicine

## 2017-11-25 VITALS — BP 107/59 | HR 55 | Temp 97.2°F | Ht 73.0 in | Wt 189.8 lb

## 2017-11-25 DIAGNOSIS — F411 Generalized anxiety disorder: Secondary | ICD-10-CM

## 2017-11-25 DIAGNOSIS — E119 Type 2 diabetes mellitus without complications: Secondary | ICD-10-CM

## 2017-11-25 LAB — BAYER DCA HB A1C WAIVED: HB A1C: 5.7 % (ref <7.0)

## 2017-11-25 MED ORDER — ALPRAZOLAM 0.25 MG PO TABS: 0.2500 mg | ORAL_TABLET | Freq: Three times a day (TID) | ORAL | 1 refills | Status: DC | PRN

## 2017-11-25 NOTE — Progress Notes (Signed)
   HPI  Patient presents today here for follow-up anxiety and diabetes.  Patient's diabetes has been historically very well controlled He is watching his diet not checking blood sugars.  Anxiety Doing well with Xanax plus citalopram, needs refill of Xanax.   PMH: Smoking status noted ROS: Per HPI  Objective: BP (!) 107/59   Pulse (!) 55   Temp (!) 97.2 F (36.2 C) (Oral)   Ht 6\' 1"  (1.854 m)   Wt 189 lb 12.8 oz (86.1 kg)   BMI 25.04 kg/m  Gen: NAD, alert, cooperative with exam HEENT: NCAT CV: RRR, good S1/S2, no murmur Resp: CTABL, no wheezes, non-labored Ext: No edema, warm Neuro: Alert and oriented, No gross deficits  Assessment and plan:  #GAD Patient doing well with Xanax plus citalopram, needs refill of Xanax today Follow-up 3 months with new PCP  #Type 2 diabetes Very well controlled previously on only metformin Labs up-to-date set for A1c Expect good control, A1c pending   Orders Placed This Encounter  Procedures  . Bayer DCA Hb A1c Waived    Meds ordered this encounter  Medications  . ALPRAZolam (XANAX) 0.25 MG tablet    Sig: Take 1 tablet (0.25 mg total) by mouth 3 (three) times daily as needed.    Dispense:  90 tablet    Refill:  [place], MD [facility] 11/25/2017, 8:29 AM

## 2017-11-25 NOTE — Patient Instructions (Signed)
Great to see you!  Come back in 3 months to see Dr. Warrick Parisian

## 2017-12-23 ENCOUNTER — Other Ambulatory Visit: Payer: Self-pay

## 2017-12-23 MED ORDER — METFORMIN HCL 500 MG PO TABS: 500.0000 mg | ORAL_TABLET | Freq: Two times a day (BID) | ORAL | 0 refills | Status: DC

## 2018-01-03 ENCOUNTER — Other Ambulatory Visit: Payer: Self-pay | Admitting: *Deleted

## 2018-01-03 MED ORDER — AMLODIPINE BESYLATE 10 MG PO TABS: 10.0000 mg | ORAL_TABLET | Freq: Every day | ORAL | 0 refills | Status: DC

## 2018-01-10 ENCOUNTER — Other Ambulatory Visit: Payer: Self-pay | Admitting: *Deleted

## 2018-01-10 MED ORDER — ATENOLOL 25 MG PO TABS: ORAL_TABLET | ORAL | 0 refills | Status: DC

## 2018-01-10 MED ORDER — OMEPRAZOLE 20 MG PO CPDR: DELAYED_RELEASE_CAPSULE | ORAL | 0 refills | Status: DC

## 2018-01-10 MED ORDER — CITALOPRAM HYDROBROMIDE 20 MG PO TABS: 20.0000 mg | ORAL_TABLET | Freq: Every day | ORAL | 0 refills | Status: DC

## 2018-02-18 ENCOUNTER — Other Ambulatory Visit: Payer: Self-pay | Admitting: *Deleted

## 2018-02-26 ENCOUNTER — Ambulatory Visit (INDEPENDENT_AMBULATORY_CARE_PROVIDER_SITE_OTHER): Payer: Medicare Other | Admitting: Family Medicine

## 2018-02-26 ENCOUNTER — Encounter: Payer: Self-pay | Admitting: Family Medicine

## 2018-02-26 VITALS — BP 139/63 | HR 56 | Temp 97.4°F | Ht 73.0 in | Wt 184.0 lb

## 2018-02-26 DIAGNOSIS — I10 Essential (primary) hypertension: Secondary | ICD-10-CM | POA: Diagnosis not present

## 2018-02-26 DIAGNOSIS — F411 Generalized anxiety disorder: Secondary | ICD-10-CM | POA: Diagnosis not present

## 2018-02-26 DIAGNOSIS — E785 Hyperlipidemia, unspecified: Secondary | ICD-10-CM | POA: Diagnosis not present

## 2018-02-26 DIAGNOSIS — Z23 Encounter for immunization: Secondary | ICD-10-CM | POA: Diagnosis not present

## 2018-02-26 DIAGNOSIS — E1169 Type 2 diabetes mellitus with other specified complication: Secondary | ICD-10-CM

## 2018-02-26 LAB — CBC WITH DIFFERENTIAL/PLATELET
Basophils Absolute: 0 10*3/uL (ref 0.0–0.2)
Basos: 1 %
EOS (ABSOLUTE): 0.1 10*3/uL (ref 0.0–0.4)
EOS: 2 %
HEMATOCRIT: 45.7 % (ref 37.5–51.0)
Hemoglobin: 15.3 g/dL (ref 13.0–17.7)
Immature Grans (Abs): 0 10*3/uL (ref 0.0–0.1)
Immature Granulocytes: 0 %
Lymphocytes Absolute: 1.3 10*3/uL (ref 0.7–3.1)
Lymphs: 30 %
MCH: 31 pg (ref 26.6–33.0)
MCHC: 33.5 g/dL (ref 31.5–35.7)
MCV: 93 fL (ref 79–97)
Monocytes Absolute: 0.4 10*3/uL (ref 0.1–0.9)
Monocytes: 9 %
Neutrophils Absolute: 2.5 10*3/uL (ref 1.4–7.0)
Neutrophils: 58 %
Platelets: 152 10*3/uL (ref 150–450)
RBC: 4.93 x10E6/uL (ref 4.14–5.80)
RDW: 14.3 % (ref 12.3–15.4)
WBC: 4.4 10*3/uL (ref 3.4–10.8)

## 2018-02-26 LAB — LIPID PANEL
Chol/HDL Ratio: 3.9 ratio (ref 0.0–5.0)
Cholesterol, Total: 154 mg/dL (ref 100–199)
HDL: 40 mg/dL (ref >39)
LDL Calculated: 94 mg/dL (ref 0–99)
Triglycerides: 99 mg/dL (ref 0–149)
VLDL CHOLESTEROL CAL: 20 mg/dL (ref 5–40)

## 2018-02-26 LAB — CMP14+EGFR
ALBUMIN: 5 g/dL — AB (ref 3.5–4.8)
ALT: 12 IU/L (ref 0–44)
AST: 24 IU/L (ref 0–40)
Albumin/Globulin Ratio: 1.7 (ref 1.2–2.2)
Alkaline Phosphatase: 79 IU/L (ref 39–117)
BUN / CREAT RATIO: 10 (ref 10–24)
BUN: 13 mg/dL (ref 8–27)
Bilirubin Total: 0.5 mg/dL (ref 0.0–1.2)
CALCIUM: 9.4 mg/dL (ref 8.6–10.2)
CO2: 22 mmol/L (ref 20–29)
CREATININE: 1.26 mg/dL (ref 0.76–1.27)
Chloride: 97 mmol/L (ref 96–106)
GFR calc non Af Amer: 55 mL/min/{1.73_m2} — ABNORMAL LOW (ref >59)
GFR, EST AFRICAN AMERICAN: 64 mL/min/{1.73_m2} (ref >59)
GLUCOSE: 109 mg/dL — AB (ref 65–99)
Globulin, Total: 3 g/dL (ref 1.5–4.5)
Potassium: 4.3 mmol/L (ref 3.5–5.2)
Sodium: 138 mmol/L (ref 134–144)
Total Protein: 8 g/dL (ref 6.0–8.5)

## 2018-02-26 LAB — BAYER DCA HB A1C WAIVED: HB A1C: 5.3 % (ref <7.0)

## 2018-02-26 MED ORDER — VALSARTAN-HYDROCHLOROTHIAZIDE 160-12.5 MG PO TABS: 1.0000 | ORAL_TABLET | Freq: Every day | ORAL | 3 refills | Status: DC

## 2018-02-26 MED ORDER — OMEPRAZOLE 20 MG PO CPDR: DELAYED_RELEASE_CAPSULE | ORAL | 3 refills | Status: DC

## 2018-02-26 MED ORDER — METFORMIN HCL 500 MG PO TABS: 500.0000 mg | ORAL_TABLET | Freq: Two times a day (BID) | ORAL | 3 refills | Status: DC

## 2018-02-26 MED ORDER — ALPRAZOLAM 0.25 MG PO TABS: 0.2500 mg | ORAL_TABLET | Freq: Three times a day (TID) | ORAL | 2 refills | Status: DC | PRN

## 2018-02-26 MED ORDER — TAMSULOSIN HCL 0.4 MG PO CAPS: 0.4000 mg | ORAL_CAPSULE | Freq: Every day | ORAL | 3 refills | Status: DC

## 2018-02-26 MED ORDER — CITALOPRAM HYDROBROMIDE 40 MG PO TABS: 40.0000 mg | ORAL_TABLET | Freq: Every day | ORAL | 3 refills | Status: DC

## 2018-02-26 MED ORDER — ATENOLOL 25 MG PO TABS: ORAL_TABLET | ORAL | 3 refills | Status: DC

## 2018-02-26 MED ORDER — AMLODIPINE BESYLATE 10 MG PO TABS: 10.0000 mg | ORAL_TABLET | Freq: Every day | ORAL | 3 refills | Status: DC

## 2018-02-26 NOTE — Progress Notes (Signed)
BP 139/63   Pulse (!) 56   Temp (!) 97.4 F (36.3 C) (Oral)   Ht '6\' 1"'  (1.854 m)   Wt 184 lb (83.5 kg)   BMI 24.28 kg/m    Subjective:    Patient ID: Dennis Melton, male    DOB: [birth year], 77 y.o.   MRN: [id number]  HPI: Dennis Melton is a 77 y.o. male presenting on 02/26/2018 for Diabetes (3 month follow up); Anxiety; Hypertension; and Establish Care Wendi Snipes pt)   HPI Type 2 diabetes mellitus Patient comes in today for recheck of his diabetes. Patient has been currently taking metformin. Patient is currently on an ACE inhibitor/ARB. Patient has not seen an ophthalmologist this year. Patient denies any issues with their feet except thickened and overgrown toenails which he wants to see a podiatrist  Hypertension Patient is currently on amlodipine and atenolol and valsartan-hydrochlorothiazide, and their blood pressure today is 139/63. Patient denies any lightheadedness or dizziness. Patient denies headaches, blurred vision, chest pains, shortness of breath, or weakness. Denies any side effects from medication and is content with current medication.   Hyperlipidemia Patient is coming in for recheck of his hyperlipidemia. The patient is currently taking omega-3's. They deny any issues with myalgias or history of liver damage from it. They deny any focal numbness or weakness or chest pain.   Anxiety depression Patient says that his anxiety and depression have both been increasing, he says he is lost a couple of grandsons in a motor vehicle accident earlier this year and that really been weighing on him and he is feeling down and more depressed and especially over the past 5 days when he ran out of his alprazolam he has been really down.  He denies any suicidal ideations or thoughts of hurting himself.  He just feels like he does need some more help and is willing to increase on medication.  He feels like the citalopram 20 mg does not do anything for him at this point. Depression screen [facility]  2/9 02/26/2018 11/25/2017 08/23/2017 05/24/2017 05/09/2017  Decreased Interest 3 0 0 0 1  Down, Depressed, Hopeless 3 0 1 1 0  PHQ - 2 Score 6 0 '1 1 1  ' Altered sleeping 3 - - - -  Tired, decreased energy 3 - - - -  Change in appetite 3 - - - -  Feeling bad or failure about yourself  1 - - - -  Trouble concentrating 0 - - - -  Moving slowly or fidgety/restless 0 - - - -  Suicidal thoughts 0 - - - -  PHQ-9 Score 16 - - - -     Relevant past medical, surgical, family and social history reviewed and updated as indicated. Interim medical history since our last visit reviewed. Allergies and medications reviewed and updated.  Review of Systems  Constitutional: Negative for chills and fever.  Eyes: Negative for visual disturbance.  Respiratory: Negative for shortness of breath and wheezing.   Cardiovascular: Negative for chest pain and leg swelling.  Musculoskeletal: Negative for back pain and gait problem.  Skin: Negative for rash.  Psychiatric/Behavioral: Positive for decreased concentration, dysphoric mood and sleep disturbance. Negative for self-injury and suicidal ideas. The patient is nervous/anxious.   All other systems reviewed and are negative.   Per HPI unless specifically indicated above   Allergies as of 02/26/2018   No Known Allergies     Medication List        Accurate as of 02/26/18  9:11  AM. Always use your most recent med list.          ALPRAZolam 0.25 MG tablet Commonly known as:  XANAX Take 1 tablet (0.25 mg total) by mouth 3 (three) times daily as needed.   amLODipine 10 MG tablet Commonly known as:  NORVASC Take 1 tablet (10 mg total) by mouth daily with breakfast.   aspirin EC 81 MG tablet Take 81 mg by mouth daily. Reported on 11/17/2015   Aspirin-Caffeine 845-65 MG Pack Take 1 Package by mouth as needed (Pain).   atenolol 25 MG tablet Commonly known as:  TENORMIN TAKE 1 TABLET BY MOUTH ONCE DAILY IN THE MORNING   cholecalciferol 1000 units  tablet Commonly known as:  VITAMIN D Take 1,000 Units by mouth daily.   citalopram 40 MG tablet Commonly known as:  CELEXA Take 1 tablet (40 mg total) by mouth daily.   Fish Oil 1200 MG Caps Take 1 capsule by mouth 2 (two) times daily.   metFORMIN 500 MG tablet Commonly known as:  GLUCOPHAGE Take 1 tablet (500 mg total) by mouth 2 (two) times daily with a meal.   omeprazole 20 MG capsule Commonly known as:  PRILOSEC TAKE 1 CAPSULE BY MOUTH 2  TIMES DAILY BEFORE A MEAL.   tamsulosin 0.4 MG Caps capsule Commonly known as:  FLOMAX Take 1 capsule (0.4 mg total) by mouth daily.   valsartan-hydrochlorothiazide 160-12.5 MG tablet Commonly known as:  DIOVAN-HCT Take 1 tablet by mouth daily.          Objective:    BP 139/63   Pulse (!) 56   Temp (!) 97.4 F (36.3 C) (Oral)   Ht '6\' 1"'  (1.854 m)   Wt 184 lb (83.5 kg)   BMI 24.28 kg/m   Wt Readings from Last 3 Encounters:  02/26/18 184 lb (83.5 kg)  11/25/17 189 lb 12.8 oz (86.1 kg)  08/23/17 198 lb (89.8 kg)    Physical Exam  Constitutional: He is oriented to person, place, and time. He appears well-developed and well-nourished. No distress.  Eyes: Conjunctivae are normal. No scleral icterus.  Neck: Neck supple. No thyromegaly present.  Cardiovascular: Normal rate, regular rhythm, normal heart sounds and intact distal pulses.  No murmur heard. Pulmonary/Chest: Effort normal and breath sounds normal. No respiratory distress. He has no wheezes.  Musculoskeletal: Normal range of motion. He exhibits no edema.  Lymphadenopathy:    He has no cervical adenopathy.  Neurological: He is alert and oriented to person, place, and time. Coordination normal.  Skin: Skin is warm and dry. No rash noted. He is not diaphoretic.  Psychiatric: His behavior is normal. His mood appears anxious. He exhibits a depressed mood. He expresses no suicidal ideation. He expresses no suicidal plans.  Nursing note and vitals reviewed.     Assessment  & Plan:   Problem List Items Addressed This Visit      Cardiovascular and Mediastinum   HTN (hypertension)   Relevant Medications   amLODipine (NORVASC) 10 MG tablet   atenolol (TENORMIN) 25 MG tablet   valsartan-hydrochlorothiazide (DIOVAN-HCT) 160-12.5 MG tablet   Other Relevant Orders   CMP14+EGFR     Endocrine   T2DM (type 2 diabetes mellitus) ([place]) - Primary   Relevant Medications   metFORMIN (GLUCOPHAGE) 500 MG tablet   valsartan-hydrochlorothiazide (DIOVAN-HCT) 160-12.5 MG tablet   Other Relevant Orders   Bayer DCA Hb A1c Waived   CBC with Differential/Platelet     Other   HLD (hyperlipidemia)  Relevant Medications   amLODipine (NORVASC) 10 MG tablet   atenolol (TENORMIN) 25 MG tablet   valsartan-hydrochlorothiazide (DIOVAN-HCT) 160-12.5 MG tablet   Other Relevant Orders   Lipid panel   Generalized anxiety disorder   Relevant Medications   ALPRAZolam (XANAX) 0.25 MG tablet   citalopram (CELEXA) 40 MG tablet   Other Relevant Orders   CBC with Differential/Platelet    Increase Celexa to 40 mg, continue alprazolam and current blood pressure medications, if A1c is as good as it has been, will likely discontinue the metformin  Follow up plan: Return in about 3 months (around 05/29/2018), or if symptoms worsen or fail to improve, for Diabetes and hypertension recheck.  Counseling provided for all of the vaccine components Orders Placed This Encounter  Procedures  . CMP14+EGFR  . Bayer DCA Hb A1c Waived  . Lipid panel  . CBC with Differential/Platelet    Caryl Pina, MD [facility] 02/26/2018, 9:11 AM

## 2018-04-04 ENCOUNTER — Ambulatory Visit: Payer: Self-pay | Admitting: Urology

## 2018-05-16 ENCOUNTER — Ambulatory Visit (INDEPENDENT_AMBULATORY_CARE_PROVIDER_SITE_OTHER): Payer: Medicare Other | Admitting: *Deleted

## 2018-05-16 VITALS — BP 129/70 | HR 54 | Ht 73.0 in | Wt 187.4 lb

## 2018-05-16 DIAGNOSIS — Z0000 Encounter for general adult medical examination without abnormal findings: Secondary | ICD-10-CM | POA: Diagnosis not present

## 2018-05-16 DIAGNOSIS — Z1211 Encounter for screening for malignant neoplasm of colon: Secondary | ICD-10-CM

## 2018-05-16 NOTE — Progress Notes (Signed)
Subjective:   Amadu Schlageter is a 78 y.o. male who presents for Medicare Annual/Subsequent preventive examination. Mr. Hughlett lives at home with his significant other Franklyn Lor of 20 years and step-son.  He has one daughter, two grand children and one great grand child.  Mr. Ulin is retired from [employer] of 29 years.  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  Mr. Worlds only eats a small lunch, big dinner and will occasionally eat a small breakfast. He does not exercise at this time and does not have and community or church involvement. Mr. Medlen does not have an Advance Directive, living will or power of attorney. Mr. Dyar has had no hospitalizations, surgeries or falls in the past year.    Objective:    Vitals: BP 129/70   Pulse (!) 54   Ht 6\' 1"  (1.854 m)   Wt 187 lb 6.4 oz (85 kg)   BMI 24.72 kg/m   Body mass index is 24.72 kg/m.  Advanced Directives 05/09/2017 09/11/2015 02/04/2015 06/09/2013  Does Patient Have a Medical Advance Directive? No No No Patient does not have advance directive;Patient would not like information  Would patient like information on creating a medical advance directive? Yes (MAU/Ambulatory/Procedural Areas - Information given) No - patient declined information No - patient declined information -  Pre-existing out of facility DNR order (yellow form or pink MOST form) - - - No  information given  Tobacco Social History   Tobacco Use  Smoking Status Current Some Day Smoker  . Packs/day: 2.00  . Years: 30.00  . Pack years: 60.00  . Types: Cigars  Smokeless Tobacco Never Used    Not ready to quit at this time   Clinical Intake:  Pre-visit preparation completed: Yes  Pain : No/denies pain     BMI - recorded: 24.72 Nutritional Status: BMI of 19-24  Normal Nutritional Risks: None Diabetes: No  How often do you need to have someone help you when  you read instructions, pamphlets, or other written materials from your doctor or pharmacy?: 1 - Never What is the last grade level you completed in school?: 10th Grade  Interpreter Needed?: No  Information entered by :: Truett Mainland, LPN  Past Medical History:  Diagnosis Date  . Anxiety   . Depression   . High cholesterol   . Hypertension    Past Surgical History:  Procedure Laterality Date  . BACK SURGERY     lumbar laminectomy  . CATARACT EXTRACTION W/PHACO Left 06/18/2013   Procedure: CATARACT EXTRACTION PHACO AND INTRAOCULAR LENS PLACEMENT (IOC);  Surgeon: Tonny Branch, MD;  Location: [facility];  Service: Ophthalmology;  Laterality: Left;  CDE:12.51   Family History  Problem Relation Age of Onset  . Diabetes Father   . Anxiety disorder Brother   . Heart disease Brother    Social History   Socioeconomic History  . Marital status: Divorced    Spouse name: Not on file  . Number of children: 1  . Years of education: 10th Grade  . Highest education level: 10th grade  Occupational History  . Occupation: Retired    Comment: Location manager  Social Needs  . Financial resource strain: Not hard at all  . Food insecurity:    Worry: Never true    Inability: Never true  . Transportation needs:    Medical: No    Non-medical: No  Tobacco Use  . Smoking status: Current Some Day Smoker    Packs/day: 2.00    Years: 30.00    Pack years: 60.00    Types: Cigars  . Smokeless tobacco: Never Used  Substance and Sexual Activity  . Alcohol use: Yes    Comment: occassionally  . Drug use: No  . Sexual activity: Yes    Birth control/protection: None  Lifestyle  . Physical activity:    Days per week: 0 days    Minutes per session: 0 min  . Stress: To some extent  Relationships  . Social connections:    Talks on phone: More than three times a week    Gets together: More than three times a week    Attends religious service: Never    Active member of club or organization: No      Attends meetings of clubs or organizations: Never    Relationship status: Divorced  Other Topics Concern  . Not on file  Social History Narrative  . Not on file    Outpatient Encounter Medications as of 05/16/2018  Medication Sig  . ALPRAZolam (XANAX) 0.25 MG tablet Take 1 tablet (0.25 mg total) by mouth 3 (three) times daily as needed.  Marland Kitchen amLODipine (NORVASC) 10 MG tablet Take 1 tablet (10 mg total) by mouth daily with breakfast.  . aspirin EC 81 MG tablet Take 81 mg by mouth daily. Reported on 11/17/2015  . Aspirin-Caffeine 845-65 MG PACK Take 1 Package by mouth as needed (Pain).  Marland Kitchen atenolol (TENORMIN) 25 MG tablet TAKE 1 TABLET BY MOUTH ONCE DAILY IN THE MORNING  . cholecalciferol (VITAMIN D) 1000 units tablet Take 1,000 Units by mouth daily.  . citalopram (CELEXA) 40 MG tablet Take 1 tablet (40 mg total) by mouth daily.  . Omega-3 Fatty Acids (FISH OIL) 1200 MG CAPS Take 1 capsule by mouth 2 (two) times daily.  Marland Kitchen omeprazole (PRILOSEC) 20 MG capsule TAKE 1 CAPSULE BY MOUTH 2  TIMES DAILY BEFORE A MEAL.  . tamsulosin (FLOMAX) 0.4 MG CAPS capsule Take 1 capsule (0.4 mg total) by mouth daily.  . valsartan-hydrochlorothiazide (DIOVAN-HCT) 160-12.5 MG tablet Take 1 tablet by mouth daily.  . [DISCONTINUED] metFORMIN (GLUCOPHAGE) 500 MG tablet Take 1 tablet (500 mg total) by mouth 2 (two) times daily with a meal. (Patient not taking: Reported on 05/16/2018)   No facility-administered encounter medications on file as of 05/16/2018.     Activities of Daily Living In your present state of health, do you have any difficulty performing the following activities: 05/16/2018  Hearing? N  Vision? N  Difficulty concentrating or making decisions? N  Walking or climbing stairs? N  Dressing or bathing? N  Doing errands, shopping? N  Some recent data might be hidden  No difficulties with ADLs  Patient Care Team: Dettinger, Fransisca Kaufmann, MD as PCP - General (Family Medicine) Irine Seal, MD as Attending  Physician (Urology)   Assessment:   This is a routine wellness examination for [place].  Exercise Activities and Dietary recommendations Current Exercise Habits: The patient does not participate in regular exercise at present  Goals    . Exercise 3x per week (30 min per time)    . Quit Smoking     Decrease amount of tobacco use weekly        Fall Risk Fall Risk  05/16/2018 02/26/2018 11/25/2017 08/23/2017 05/24/2017  Falls in the past year? 0 No No Yes No  Number falls in past yr: 0 - -  1 -  Injury with Fall? 0 - - No -  Comment - - - - -   Is the patient's home free of loose throw rugs in walkways, pet beds, electrical cords, etc?   no      Grab bars in the bathroom? yes      Handrails on the stairs?   yes      Adequate lighting?   yes  Timed Get Up and Go Performed:   Depression Screen PHQ 2/9 Scores 05/16/2018 02/26/2018 11/25/2017 08/23/2017  PHQ - 2 Score 1 6 0 1  PHQ- 9 Score - 16 - -    Cognitive Function MMSE - Mini Mental State Exam 05/16/2018 05/09/2017  Orientation to time 5 5  Orientation to Place 5 5  Registration 3 3  Attention/ Calculation 5 5  Recall 3 1  Language- name 2 objects 2 2  Language- repeat 1 1  Language- follow 3 step command 3 3  Language- read & follow direction 1 1  Write a sentence 1 1  Copy design 1 1  Total score 30 28  No memory loss noted at this time      Immunization History  Administered Date(s) Administered  . Influenza, High Dose Seasonal PF 05/22/2016, 02/21/2017, 02/26/2018  . Influenza,inj,Quad PF,6+ Mos 03/20/2013, 03/17/2014, 02/09/2015  . Pneumococcal Conjugate-13 11/17/2015  . Pneumococcal Polysaccharide-23 01/12/2010  . Tdap 11/03/2013  . Zoster 11/20/2010    Qualifies for Shingles Vaccine? Yes, declined shingrix at this time  Screening Tests Health Maintenance  Topic Date Due  . OPHTHALMOLOGY EXAM  12/17/2016  . COLONOSCOPY  05/17/2019 (Originally 11/27/2017)  . HEMOGLOBIN A1C  08/28/2018  . FOOT EXAM   02/27/2019  . TETANUS/TDAP  11/04/2023  . INFLUENZA VACCINE  Completed  . PNA vac Low Risk Adult  Completed  Will call to set up Eye exam Colonoscopy ordered Cancer Screenings: Lung: Low Dose CT Chest recommended if Age 23-80 years, 30 pack-year currently smoking OR have quit w/in 15years. Patient does qualify. Colorectal: Colonoscopy ordered  Additional Screenings:  Hepatitis C Screening:      Plan:   Encouraged Mr. Dozier to eat 3 healthy meals daily that consist of lean proteins, fruits and vegetables.  Encouraged to try to exercise for at least 30 minutes, 3 times weekly.  Explained the importance of smoking cessation.  Colonoscopy ordered.  Encouraged patient to call to set up routine eye exam.  Advance directive packet given and encourage to look over and discuss with significant other.  Encouraged to keep follow up appointment with Dr. Warrick Parisian.   I have personally reviewed and noted the following in the patient's chart:   . Medical and social history . Use of alcohol, tobacco or illicit drugs  . Current medications and supplements . Functional ability and status . Nutritional status . Physical activity . Advanced directives . List of other physicians . Hospitalizations, surgeries, and ER visits in previous 12 months . Vitals . Screenings to include cognitive, depression, and falls . Referrals and appointments  In addition, I have reviewed and discussed with patient certain preventive protocols, quality metrics, and best practice recommendations. A written personalized care plan for preventive services as well as general preventive health recommendations were provided to patient.     Wardell Heath, LPN  08/19/5460

## 2018-05-16 NOTE — Patient Instructions (Addendum)
Call to set up Colonoscopy Schedule routine eye exam Keep follow up with Dr. Warrick Parisian on January 16th, 2020 at 8:25Am   Mr. Dennis Melton , Thank you for taking time to come for your Medicare Wellness Visit. I appreciate your ongoing commitment to your health goals. Please review the following plan we discussed and let me know if I can assist you in the future.   These are the goals we discussed: Goals    . Exercise 3x per week (30 min per time)    . Quit Smoking     Decrease amount of tobacco use weekly        This is a list of the screening recommended for you and due dates:  Health Maintenance  Topic Date Due  . Eye exam for diabetics  12/17/2016  . Colon Cancer Screening  11/27/2017  . Hemoglobin A1C  08/28/2018  . Complete foot exam   02/27/2019  . Tetanus Vaccine  11/04/2023  . Flu Shot  Completed  . Pneumonia vaccines  Completed      Advance Directive  Advance directives are legal documents that let you make choices ahead of time about your health care and medical treatment in case you become unable to communicate for yourself. Advance directives are a way for you to communicate your wishes to family, friends, and health care providers. This can help convey your decisions about end-of-life care if you become unable to communicate. Discussing and writing advance directives should happen over time rather than all at once. Advance directives can be changed depending on your situation and what you want, even after you have signed the advance directives. If you do not have an advance directive, some states assign family decision makers to act on your behalf based on how closely you are related to them. Each state has its own laws regarding advance directives. You may want to check with your health care provider, attorney, or state representative about the laws in your state. There are different types of advance directives, such as:  Medical power of attorney.  Living will.  Do  not resuscitate (DNR) or do not attempt resuscitation (DNAR) order. Health care proxy and medical power of attorney A health care proxy, also called a health care agent, is a person who is appointed to make medical decisions for you in cases in which you are unable to make the decisions yourself. Generally, people choose someone they know well and trust to represent their preferences. Make sure to ask this person for an agreement to act as your proxy. A proxy may have to exercise judgment in the event of a medical decision for which your wishes are not known. A medical power of attorney is a legal document that names your health care proxy. Depending on the laws in your state, after the document is written, it may also need to be:  Signed.  Notarized.  Dated.  Copied.  Witnessed.  Incorporated into your medical record. You may also want to appoint someone to manage your financial affairs in a situation in which you are unable to do so. This is called a durable power of attorney for finances. It is a separate legal document from the durable power of attorney for health care. You may choose the same person or someone different from your health care proxy to act as your agent in financial matters. If you do not appoint a proxy, or if there is a concern that the proxy is not acting in your best  interests, a court-appointed guardian may be designated to act on your behalf. Living will A living will is a set of instructions documenting your wishes about medical care when you cannot express them yourself. Health care providers should keep a copy of your living will in your medical record. You may want to give a copy to family members or friends. To alert caregivers in case of an emergency, you can place a card in your wallet to let them know that you have a living will and where they can find it. A living will is used if you become:  Terminally ill.  Incapacitated.  Unable to communicate or make  decisions. Items to consider in your living will include:  The use or non-use of life-sustaining equipment, such as dialysis machines and breathing machines (ventilators).  A DNR or DNAR order, which is the instruction not to use cardiopulmonary resuscitation (CPR) if breathing or heartbeat stops.  The use or non-use of tube feeding.  Withholding of food and fluids.  Comfort (palliative) care when the goal becomes comfort rather than a cure.  Organ and tissue donation. A living will does not give instructions for distributing your money and property if you should pass away. It is recommended that you seek the advice of a lawyer when writing a will. Decisions about taxes, beneficiaries, and asset distribution will be legally binding. This process can relieve your family and friends of any concerns surrounding disputes or questions that may come up about the distribution of your assets. DNR or DNAR A DNR or DNAR order is a request not to have CPR in the event that your heart stops beating or you stop breathing. If a DNR or DNAR order has not been made and shared, a health care provider will try to help any patient whose heart has stopped or who has stopped breathing. If you plan to have surgery, talk with your health care provider about how your DNR or DNAR order will be followed if problems occur. Summary  Advance directives are the legal documents that allow you to make choices ahead of time about your health care and medical treatment in case you become unable to communicate for yourself.  The process of discussing and writing advance directives should happen over time. You can change the advance directives, even after you have signed them.  Advance directives include DNR or DNAR orders, living wills, and designating an agent as your medical power of attorney. This information is not intended to replace advice given to you by your health care provider. Make sure you discuss any questions you  have with your health care provider. Document Released: 08/07/2007 Document Revised: 03/19/2016 Document Reviewed: 03/19/2016 Elsevier Interactive Patient Education  2019 [employer].

## 2018-05-26 ENCOUNTER — Ambulatory Visit: Payer: Self-pay | Admitting: Licensed Clinical Social Worker

## 2018-05-26 ENCOUNTER — Encounter: Payer: Self-pay | Admitting: Licensed Clinical Social Worker

## 2018-05-26 DIAGNOSIS — F411 Generalized anxiety disorder: Secondary | ICD-10-CM

## 2018-05-26 NOTE — Progress Notes (Signed)
This encounter was created in error - please disregard.

## 2018-05-26 NOTE — Patient Instructions (Signed)
Licensed Clinical Social Worker Visit Information  Dennis Melton was given information about Chronic Care Management services today including:  1. CCM service includes personalized support from designated clinical staff supervised by his physician, including individualized plan of care and coordination with other care providers 2. 24/7 contact phone numbers for assistance for urgent and routine care needs. 3. Service will only be billed when office clinical staff spend 20 minutes or more in a month to coordinate care. 4. Only one practitioner may furnish and bill the service in a calendar month. 5. The patient may stop CCM services at any time (effective at the end of the month) by phone call to the office staff. 6. The patient will be responsible for cost sharing (co-pay) of up to 20% of the service fee (after annual deductible is met).  Patient did not agree to [facility] services but is interested and will consider information provided between now and his visit with Dr. Warrick Parisian on 05/29/2018.   The patient verbalized understanding of instructions provided today and declined a print copy of patient instruction materials.   Norva Riffle.Brier Firebaugh MSW, LCSW Licensed Clinical Social Worker [facility] [phone]

## 2018-05-26 NOTE — Chronic Care Management (AMB) (Signed)
  Chronic Care Management    Clinical Social Work Introduction Note  05/26/2018 Name: Dennis Melton MRN: [id number] DOB: 07-25-1940  LCSW spoke with Dennis Melton who is a 78 y.o. year old male who sees Dettinger, Fransisca Kaufmann, MD for primary care. Dettinger, Fransisca Kaufmann, MD asked LCSW to speak with  Dennis Melton to provide information about clinical social work services available for assessment of psychosocial needs and support.   Dennis Melton was given information about Chronic Care Management services today including:  1. CCM service includes personalized support from designated clinical staff supervised by his physician, including individualized plan of care and coordination with other care providers 2. 24/7 contact phone numbers for assistance for urgent and routine care needs. 3. Service will only be billed when office clinical staff spend 20 minutes or more in a month to coordinate care. 4. Only one practitioner may furnish and bill the service in a calendar month. 5. The patient may stop CCM services at any time (effective at the end of the month) by phone call to the office staff. 6. The patient will be responsible for cost sharing (co-pay) of up to 20% of the service fee (after annual deductible is met).  Patient did not agree to Dennis Melton services but is considering services and agreed for LCSW follow up.   Plan:  LCSW will follow up with provider in person today re: my conversation with Dennis Melton and I will follow up with patient after his conversation with PCP.    [name] MSW, LCSW Licensed Clinical Social Worker [facility] [phone]

## 2018-05-29 ENCOUNTER — Ambulatory Visit (INDEPENDENT_AMBULATORY_CARE_PROVIDER_SITE_OTHER): Payer: Medicare Other | Admitting: Family Medicine

## 2018-05-29 ENCOUNTER — Telehealth: Payer: Self-pay | Admitting: Family Medicine

## 2018-05-29 ENCOUNTER — Ambulatory Visit: Payer: Self-pay | Admitting: *Deleted

## 2018-05-29 ENCOUNTER — Encounter (INDEPENDENT_AMBULATORY_CARE_PROVIDER_SITE_OTHER): Payer: Self-pay | Admitting: *Deleted

## 2018-05-29 ENCOUNTER — Encounter: Payer: Self-pay | Admitting: Family Medicine

## 2018-05-29 VITALS — BP 135/71 | HR 60 | Temp 97.2°F | Ht 73.0 in | Wt 187.0 lb

## 2018-05-29 DIAGNOSIS — E785 Hyperlipidemia, unspecified: Secondary | ICD-10-CM

## 2018-05-29 DIAGNOSIS — E1169 Type 2 diabetes mellitus with other specified complication: Secondary | ICD-10-CM

## 2018-05-29 DIAGNOSIS — I10 Essential (primary) hypertension: Secondary | ICD-10-CM

## 2018-05-29 DIAGNOSIS — F411 Generalized anxiety disorder: Secondary | ICD-10-CM

## 2018-05-29 DIAGNOSIS — Z9189 Other specified personal risk factors, not elsewhere classified: Secondary | ICD-10-CM

## 2018-05-29 LAB — BMP8+EGFR
BUN / CREAT RATIO: 8 — AB (ref 10–24)
BUN: 9 mg/dL (ref 8–27)
CALCIUM: 9.7 mg/dL (ref 8.6–10.2)
CHLORIDE: 98 mmol/L (ref 96–106)
CO2: 20 mmol/L (ref 20–29)
Creatinine, Ser: 1.1 mg/dL (ref 0.76–1.27)
GFR calc non Af Amer: 65 mL/min/{1.73_m2} (ref >59)
GFR, EST AFRICAN AMERICAN: 75 mL/min/{1.73_m2} (ref >59)
GLUCOSE: 120 mg/dL — AB (ref 65–99)
Potassium: 4.6 mmol/L (ref 3.5–5.2)
Sodium: 138 mmol/L (ref 134–144)

## 2018-05-29 LAB — BAYER DCA HB A1C WAIVED: HB A1C: 5.2 % (ref <7.0)

## 2018-05-29 MED ORDER — ATORVASTATIN CALCIUM 20 MG PO TABS: 20.0000 mg | ORAL_TABLET | Freq: Every day | ORAL | 3 refills | Status: DC

## 2018-05-29 MED ORDER — ALPRAZOLAM 0.25 MG PO TABS: 0.2500 mg | ORAL_TABLET | Freq: Three times a day (TID) | ORAL | 2 refills | Status: DC | PRN

## 2018-05-29 NOTE — Progress Notes (Signed)
BP 135/71   Pulse 60   Temp (!) 97.2 F (36.2 C) (Oral)   Ht '6\' 1"'  (1.854 m)   Wt 187 lb (84.8 kg)   BMI 24.67 kg/m    Subjective:    Patient ID: Dennis Melton, male    DOB: [birth year], 78 y.o.   MRN: [id number]  HPI: Dennis Melton is a 78 y.o. male presenting on 05/29/2018 for Diabetes (3 month follow up); Hypertension; and Hyperlipidemia   HPI Type 2 diabetes mellitus Patient comes in today for recheck of his diabetes. Patient has been currently taking no medication for the diabetes and we are just monitoring for now.  That has been mostly under control for him. Patient is currently on an ACE inhibitor/ARB. Patient has not seen an ophthalmologist this year. Patient denies any issues with their feet.   Hypertension Patient is currently on amlodipine and atenolol and valsartan-hydrochlorothiazide, and their blood pressure today is 135/71. Patient denies any lightheadedness or dizziness. Patient denies headaches, blurred vision, chest pains, shortness of breath, or weakness. Denies any side effects from medication and is content with current medication.   Hyperlipidemia Patient is coming in for recheck of his hyperlipidemia. The patient is currently taking Lipitor. They deny any issues with myalgias or history of liver damage from it. They deny any focal numbness or weakness or chest pain.   Anxiety Patient is coming in for refill of his anxiety medication including citalopram and alprazolam and says that they have been doing well for him and denies any issues with it.  Patient denies any suicidal ideations or major depression.  He says his anxiety has been pretty well controlled Depression screen [facility] 2/9 05/29/2018 05/16/2018 02/26/2018 11/25/2017 08/23/2017  Decreased Interest 1 0 3 0 0  Down, Depressed, Hopeless 0 1 3 0 1  PHQ - 2 Score '1 1 6 ' 0 1  Altered sleeping - - 3 - -  Tired, decreased energy - - 3 - -  Change in appetite - - 3 - -  Feeling bad or failure about yourself  - - 1 - -   Trouble concentrating - - 0 - -  Moving slowly or fidgety/restless - - 0 - -  Suicidal thoughts - - 0 - -  PHQ-9 Score - - 16 - -     Relevant past medical, surgical, family and social history reviewed and updated as indicated. Interim medical history since our last visit reviewed. Allergies and medications reviewed and updated.  Review of Systems  Constitutional: Negative for chills and fever.  Eyes: Negative for discharge.  Respiratory: Negative for shortness of breath and wheezing.   Cardiovascular: Negative for chest pain and leg swelling.  Musculoskeletal: Negative for back pain and gait problem.  Skin: Negative for rash.  Neurological: Negative for dizziness, weakness, light-headedness, numbness and headaches.  Psychiatric/Behavioral: Negative for decreased concentration, dysphoric mood, self-injury, sleep disturbance and suicidal ideas. The patient is nervous/anxious.   All other systems reviewed and are negative.   Per HPI unless specifically indicated above   Allergies as of 05/29/2018   No Known Allergies     Medication List       Accurate as of May 29, 2018  8:34 AM. Always use your most recent med list.        ALPRAZolam 0.25 MG tablet Commonly known as:  XANAX Take 1 tablet (0.25 mg total) by mouth 3 (three) times daily as needed.   amLODipine 10 MG tablet Commonly known as:  NORVASC  Take 1 tablet (10 mg total) by mouth daily with breakfast.   aspirin EC 81 MG tablet Take 81 mg by mouth daily. Reported on 11/17/2015   Aspirin-Caffeine 845-65 MG Pack Take 1 Package by mouth as needed (Pain).   atenolol 25 MG tablet Commonly known as:  TENORMIN TAKE 1 TABLET BY MOUTH ONCE DAILY IN THE MORNING   cholecalciferol 1000 units tablet Commonly known as:  VITAMIN D Take 1,000 Units by mouth daily.   citalopram 40 MG tablet Commonly known as:  CELEXA Take 1 tablet (40 mg total) by mouth daily.   Fish Oil 1200 MG Caps Take 1 capsule by mouth 2 (two)  times daily.   omeprazole 20 MG capsule Commonly known as:  PRILOSEC TAKE 1 CAPSULE BY MOUTH 2  TIMES DAILY BEFORE A MEAL.   tamsulosin 0.4 MG Caps capsule Commonly known as:  FLOMAX Take 1 capsule (0.4 mg total) by mouth daily.   valsartan-hydrochlorothiazide 160-12.5 MG tablet Commonly known as:  DIOVAN-HCT Take 1 tablet by mouth daily.          Objective:    BP 135/71   Pulse 60   Temp (!) 97.2 F (36.2 C) (Oral)   Ht '6\' 1"'  (1.854 m)   Wt 187 lb (84.8 kg)   BMI 24.67 kg/m   Wt Readings from Last 3 Encounters:  05/29/18 187 lb (84.8 kg)  05/16/18 187 lb 6.4 oz (85 kg)  02/26/18 184 lb (83.5 kg)    Physical Exam Vitals signs and nursing note reviewed.  Constitutional:      General: He is not in acute distress.    Appearance: He is well-developed. He is not diaphoretic.  Eyes:     General: No scleral icterus.       Right eye: No discharge.     Conjunctiva/sclera: Conjunctivae normal.     Pupils: Pupils are equal, round, and reactive to light.  Neck:     Musculoskeletal: Neck supple.     Thyroid: No thyromegaly.  Cardiovascular:     Rate and Rhythm: Normal rate and regular rhythm.     Heart sounds: Normal heart sounds. No murmur.  Pulmonary:     Effort: Pulmonary effort is normal. No respiratory distress.     Breath sounds: Normal breath sounds. No wheezing.  Musculoskeletal: Normal range of motion.  Lymphadenopathy:     Cervical: No cervical adenopathy.  Skin:    General: Skin is warm and dry.     Findings: No rash.  Neurological:     Mental Status: He is alert and oriented to person, place, and time.     Coordination: Coordination normal.  Psychiatric:        Mood and Affect: Mood is anxious. Mood is not depressed.        Behavior: Behavior normal.        Thought Content: Thought content does not include suicidal ideation. Thought content does not include suicidal plan.         Assessment & Plan:   Problem List Items Addressed This Visit       Cardiovascular and Mediastinum   HTN (hypertension)   Relevant Medications   atorvastatin (LIPITOR) 20 MG tablet   Other Relevant Orders   BMP8+EGFR (Completed)     Endocrine   T2DM (type 2 diabetes mellitus) ([place]) - Primary   Relevant Medications   atorvastatin (LIPITOR) 20 MG tablet   Other Relevant Orders   Bayer DCA Hb A1c Waived (Completed)   BMP8+EGFR (  Completed)     Other   HLD (hyperlipidemia)   Relevant Medications   atorvastatin (LIPITOR) 20 MG tablet   Generalized anxiety disorder   Relevant Medications   ALPRAZolam (XANAX) 0.25 MG tablet    Other Visit Diagnoses    [place] cardiac risk >20% in next 10 years         We will do refill of patient's anxiety medication including alprazolam and citalopram Continue Lipitor and continue diet control for diabetes.  Follow up plan: Return in about 3 months (around 08/28/2018), or if symptoms worsen or fail to improve, for Diabetes hypertension and anxiety.  Counseling provided for all of the vaccine components No orders of the defined types were placed in this encounter.   Caryl Pina, MD [facility] 05/29/2018, 8:34 AM    The 10-year ASCVD risk score Mikey Bussing DC Brooke Bonito., et al., 2013) is: 55.1%   Values used to calculate the score:     Age: 40 years     Sex: Male     Is Non-Hispanic African American: No     Diabetic: Yes     Tobacco smoker: Yes     Systolic Blood Pressure: 941 mmHg     Is BP treated: Yes     HDL Cholesterol: 40 mg/dL     Total Cholesterol: 154 mg/dL

## 2018-05-29 NOTE — Telephone Encounter (Signed)
Gave this to [place] and she is changing it

## 2018-05-29 NOTE — Chronic Care Management (AMB) (Signed)
Dennis Melton is a 78 y.o. year old male who sees Dennis Pina, MD for primary care. Dr Dennis Melton asked the CCM team to consult the patient for assistance with chronic disease management related to DM, HTN, anxiety, and depression. Dennis Forrest, LCSW reached out to the patient by phone earlier in the week and offered CCM services to help manage his anxiety and depression. He declined services at that time. I met with Mr Dennis Melton face to face after his office visit with Dr Dennis Melton to discuss CCM from the nurse case management perspective.   Although he has a diagnosis of diabetes he is not on any medication or prescribed diet. He states that Dr Dennis Melton initially diagnosed him with diabetes years ago and started him on Metformin. Since that time he has lost around 40 lbs. Metformin was discontinued and his A1C results have remained within the normal range. He does watch his sugar and carbohydrate intake but is not on a particular diet. His hypertension is controlled with medication and he checks his blood pressures at home regularly. He has utilized the free resources that [employer] offers to purchase a new blood pressure monitor.   We also discussed his anxiety and depression and he states that his symptoms have improved some since Dr Dennis Melton increased his Celexa to 68m in October. He is dealing with situation depression and grief related to the sudden death of his grandson recently. He was visibly sad and almost tearful when discussing that. He states that he does have family that he talks to and that helps with grief management. I reminded Mr TSilberthat our embedded LCSW is available if he would like to talk with him about his depression and grief. He stated understanding but declined at this time.   CCM services were offered by our LCSW on 05/26/18 and again today while we talked. Mr Dennis Melton that his medical conditions are stable enough to continue self management on his own at this time but  he is willing to reach out if anything changes.   Objective:  BP Readings from Last 3 Encounters:  05/29/18 135/71  05/16/18 129/70  02/26/18 139/63    Lab Results  Component Value Date   HGBA1C 5.3 02/26/2018   HGBA1C 5.7 11/25/2017   HGBA1C 5.5 02/21/2017   Lab Results  Component Value Date   MICROALBUR positive 12/18/2012   LDLCALC 94 02/26/2018   CREATININE 1.26 02/26/2018   Depression screen PHQ 2/9 05/29/2018 05/16/2018 02/26/2018  Decreased Interest 1 0 3  Down, Depressed, Hopeless 0 1 3  PHQ - 2 Score _0 Altered sleeping - - 3  Tired, decreased energy - - 3  Change in appetite - - 3  Feeling bad or failure about yourself  - - 1  Trouble concentrating - - 0  Moving slowly or fidgety/restless - - 0  Suicidal thoughts - - 0  PHQ-9 Score - - 16    Plan Chart review and outreach by telephone within the next two months to reassess CCM needs Remain available for CCM services at the patient's request   KChong Sicilian RN-BC, BSN Nurse Case Manager [place]: ([phone]

## 2018-05-29 NOTE — Patient Instructions (Signed)
Visit Information   Education or Materials Provided:  1. Counseling available onsite with our LCSW for anxiety, depression, and grief 2. We discussed limiting sugars and simple carbohydrates in your diet to continue self management of blood sugars within a normal range 3. We discussed resources available through the [employer] and the ways to obtain those  The patient verbalized understanding of instructions provided today and declined a print copy of patient instruction materials.    Chong Sicilian, RN-BC, BSN Nurse Case Manager [facility] Ph: [phone]

## 2018-06-11 ENCOUNTER — Ambulatory Visit: Payer: Self-pay | Admitting: Licensed Clinical Social Worker

## 2018-06-11 DIAGNOSIS — F411 Generalized anxiety disorder: Secondary | ICD-10-CM

## 2018-06-11 DIAGNOSIS — I10 Essential (primary) hypertension: Secondary | ICD-10-CM

## 2018-06-11 DIAGNOSIS — E119 Type 2 diabetes mellitus without complications: Secondary | ICD-10-CM

## 2018-06-11 NOTE — Patient Instructions (Signed)
Mr. Allender was given information about Chronic Care Management services today including:  1. CCM service includes personalized support from designated clinical staff supervised by his physician, including individualized plan of care and coordination with other care providers 2. 24/7 contact phone numbers for assistance for urgent and routine care needs. 3. Service will only be billed when office clinical staff spend 20 minutes or more in a month to coordinate care. 4. Only one practitioner may furnish and bill the service in a calendar month. 5. The patient may stop CCM services at any time (effective at the end of the month) by phone call to the office staff. 6. The patient will be responsible for cost sharing (co-pay) of up to 20% of the service fee (after annual deductible is met).  Patient did not agree to services and does not wish to consider at this time.   Follow UP Plan: Client to attend medical appointments as scheduled with Dr. Warrick Parisian.   The patient verbalized understanding of instructions provided today and declined a print copy of patient instruction materials.   Norva Riffle.Jiovany Scheffel MSW, LCSW Licensed Clinical Social Worker [facility] [phone]

## 2018-06-11 NOTE — Chronic Care Management (AMB) (Signed)
  Chronic Care Management    Clinical Social Work General Follow Up Note  06/11/2018 Name: Dennis Melton MRN: [id number] DOB: 19-Nov-1940   Referred by: PCP, Dr.Joshua A Dettinger for psychosocial assessment  Review of patient status, including review of consultants reports, relevant laboratory and other test results, and collaboration with appropriate care team members and the patient's provider was performed as part of comprehensive patient evaluation and provision of chronic care management services.    Last CCM Appointment: 06/11/2018  Depression screen PHQ 2/9 05/29/2018  Decreased Interest 1  Down, Depressed, Hopeless 0  PHQ - 2 Score 1  Altered sleeping -  Tired, decreased energy -  Change in appetite -  Feeling bad or failure about yourself  -  Trouble concentrating -  Moving slowly or fidgety/restless -  Suicidal thoughts -  PHQ-9 Score -    Dennis Melton was given information about Chronic Care Management services today including:  1. CCM service includes personalized support from designated clinical staff supervised by his physician, including individualized plan of care and coordination with other care providers 2. 24/7 contact phone numbers for assistance for urgent and routine care needs. 3. Service will only be billed when office clinical staff spend 20 minutes or more in a month to coordinate care. 4. Only one practitioner may furnish and bill the service in a calendar month. 5. The patient may stop CCM services at any time (effective at the end of the month) by phone call to the office staff. 6. The patient will be responsible for cost sharing (co-pay) of up to 20% of the service fee (after annual deductible is met).  Patient did not agree to services and does not wish to consider at this time.   Follow Up Plan: Client to attend medical appointments as scheduled with Dr. Warrick Parisian.    Norva Riffle.Idamay Hosein MSW, LCSW Licensed Clinical Social Worker [facility] [phone]

## 2018-06-25 ENCOUNTER — Other Ambulatory Visit (INDEPENDENT_AMBULATORY_CARE_PROVIDER_SITE_OTHER): Payer: Self-pay | Admitting: *Deleted

## 2018-06-25 DIAGNOSIS — Z86010 Personal history of colonic polyps: Secondary | ICD-10-CM | POA: Insufficient documentation

## 2018-06-28 IMAGING — CT CT CHEST LUNG CANCER SCREENING LOW DOSE W/O CM
1 series · 10 of 10 positions shown, 13 images · non-contrast
Comparison: 09/08/2011 chest radiograph.

CLINICAL DATA: 76-year-old asymptomatic male current smoker with 38
pack-year smoking history.

EXAM:
CT CHEST WITHOUT CONTRAST LOW-DOSE FOR LUNG CANCER SCREENING
TECHNIQUE: Multidetector CT imaging of the chest was performed following the
standard protocol without IV contrast.

[ct lung segmentation data · axial · 0.73mm/px · z∈[+1099,+1099]mm · 10 of 316 frames shown]
[frame 1/316  mediastinal]
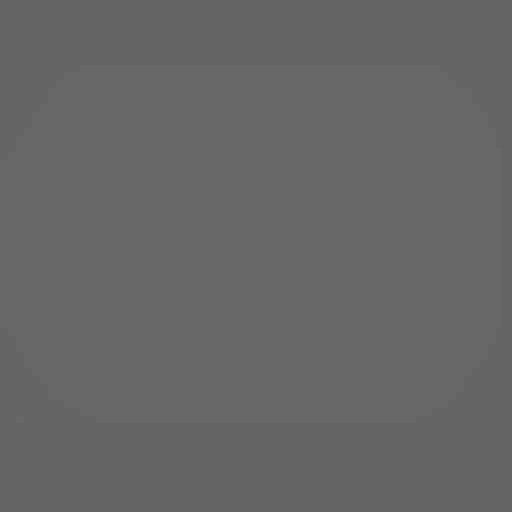
[frame 1/316  lung]
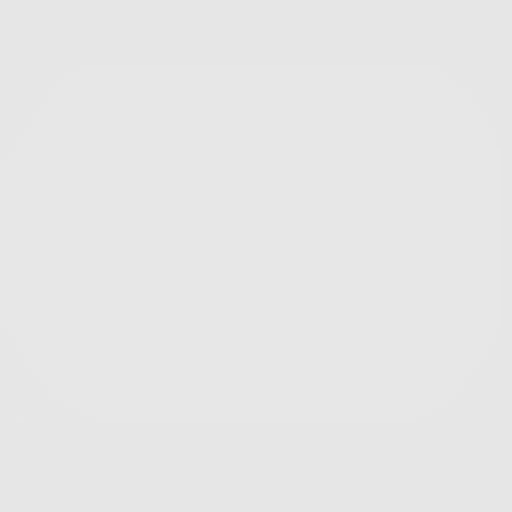
[frame 36/316  lung]
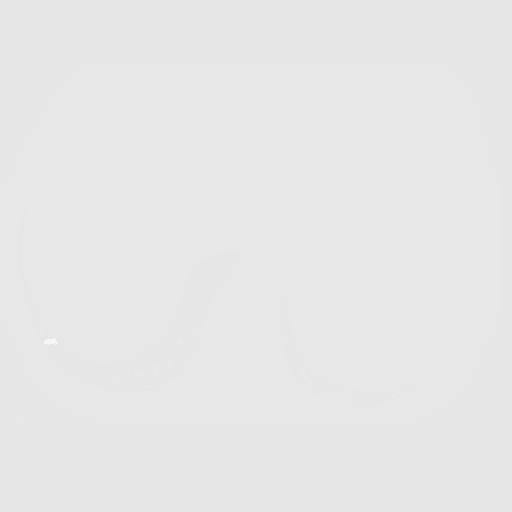
[frame 71/316  lung]
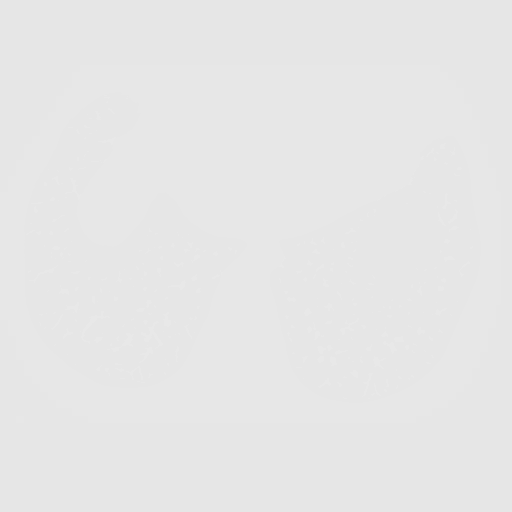
[frame 106/316  lung]
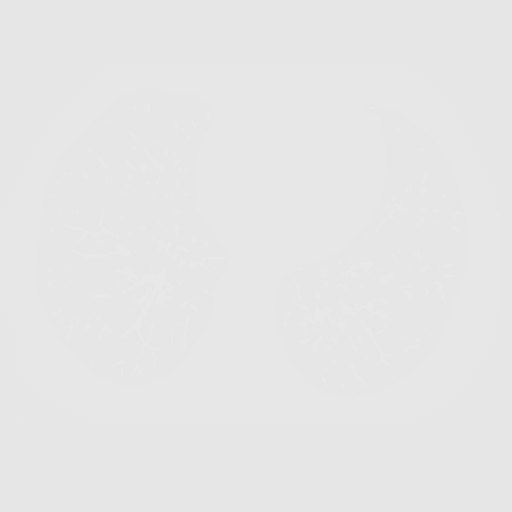
[frame 141/316  mediastinal]
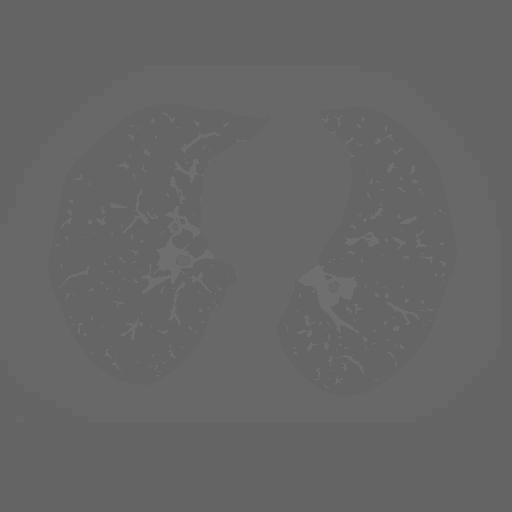
[frame 141/316  lung]
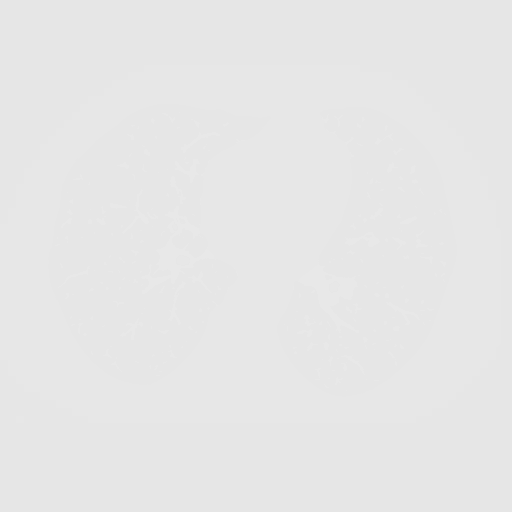
[frame 176/316  lung]
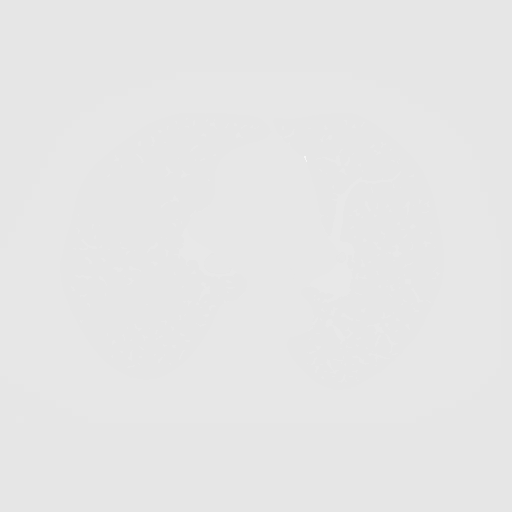
[frame 211/316  lung]
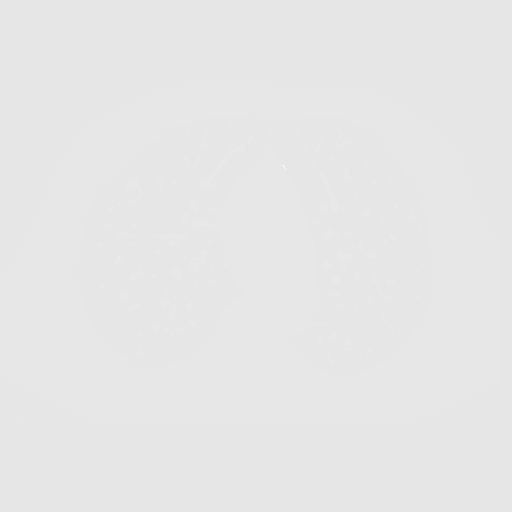
[frame 246/316  lung]
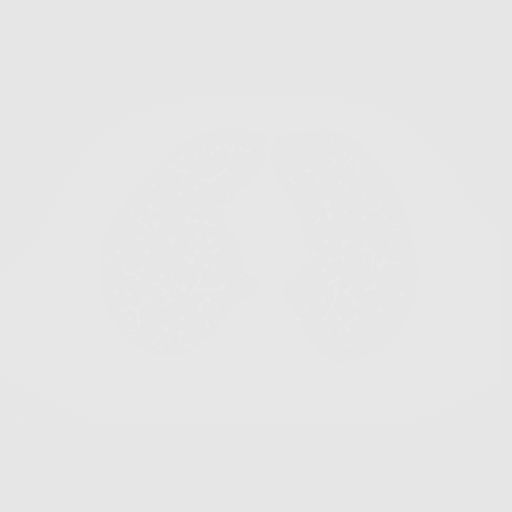
[frame 281/316  mediastinal]
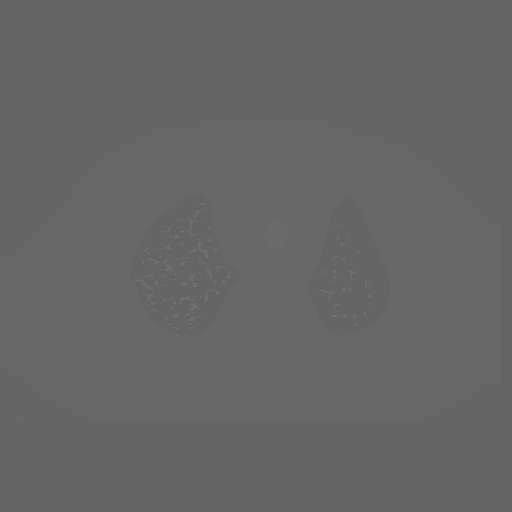
[frame 281/316  lung]
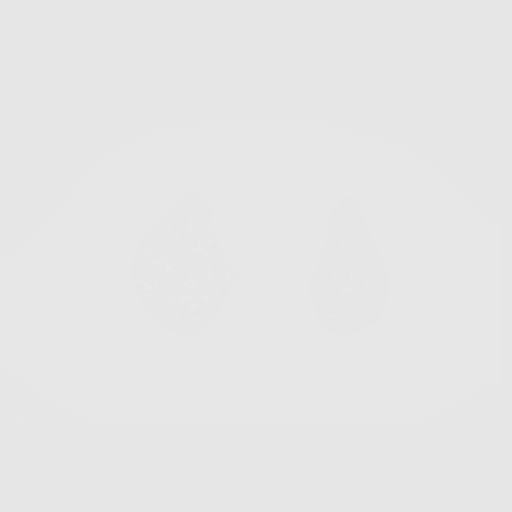
[frame 316/316  lung]
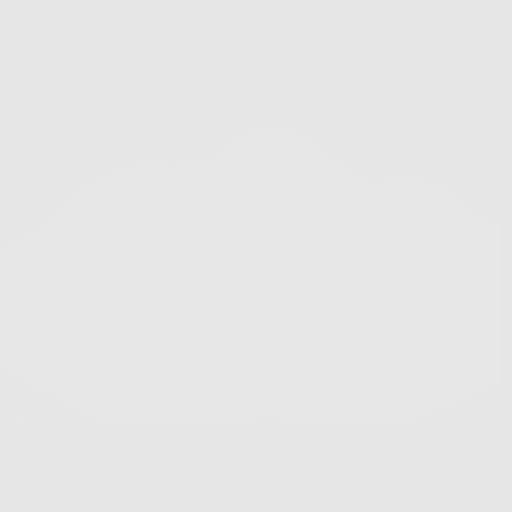

[10 of 10 positions shown; findings below may reference images not displayed]

FINDINGS: Cardiovascular: Normal heart size. No significant pericardial
fluid/thickening. Left main and 3 vessel coronary atherosclerosis.
Atherosclerotic thoracic aorta with ectatic 4.2 cm ascending
thoracic aorta. Normal caliber pulmonary arteries.

Mediastinum/Nodes: No discrete thyroid nodules. Unremarkable
esophagus. No pathologically enlarged axillary, mediastinal or gross
hilar lymph nodes, noting limited sensitivity for the detection of
hilar adenopathy on this noncontrast study.

Lungs/Pleura: No pneumothorax. No pleural effusion. Mild
centrilobular emphysema with mild diffuse bronchial wall thickening.
No acute consolidative airspace disease or lung masses. A few
scattered small solid pulmonary nodules, largest 4.0 mm in volume
derived mean diameter in the right lower lobe (series 4/image 238).
Non solid right lower lobe pulmonary nodule measuring 5.7 mm in
volume derived mean diameter (series 4/image 236).

Upper abdomen: Mild diffuse hepatic steatosis.

Musculoskeletal: No aggressive appearing focal osseous lesions. Mild
gynecomastia, asymmetric to the left. Mild thoracic spondylosis.
IMPRESSION: 1. Lung-RADS 2, benign appearance or behavior. Continue annual
screening with low-dose chest CT without contrast in 12 months.
2. Left main and 3 vessel coronary atherosclerosis.
3. Ectatic 4.2 cm ascending thoracic aorta. Recommend annual imaging
followup by CTA or MRA. This recommendation follows 7717
ACCF/AHA/AATS/ACR/ASA/SCA/BETHELL/CATIN/MV/RTOYOTA Guidelines for the
Diagnosis and Management of Patients with Thoracic Aortic Disease.
Circulation. 7717; 121: [id number].
4. Mild diffuse hepatic steatosis.

Aortic Atherosclerosis (TJGSP-OXX.X) and Emphysema (TJGSP-I7K.2).

## 2018-09-04 ENCOUNTER — Other Ambulatory Visit: Payer: Self-pay

## 2018-09-04 ENCOUNTER — Encounter: Payer: Self-pay | Admitting: Family Medicine

## 2018-09-04 ENCOUNTER — Ambulatory Visit (INDEPENDENT_AMBULATORY_CARE_PROVIDER_SITE_OTHER): Payer: Medicare Other | Admitting: Family Medicine

## 2018-09-04 DIAGNOSIS — I10 Essential (primary) hypertension: Secondary | ICD-10-CM

## 2018-09-04 DIAGNOSIS — F411 Generalized anxiety disorder: Secondary | ICD-10-CM

## 2018-09-04 DIAGNOSIS — E785 Hyperlipidemia, unspecified: Secondary | ICD-10-CM | POA: Diagnosis not present

## 2018-09-04 MED ORDER — ATORVASTATIN CALCIUM 20 MG PO TABS: 20.0000 mg | ORAL_TABLET | Freq: Every day | ORAL | 3 refills | Status: DC

## 2018-09-04 MED ORDER — ALPRAZOLAM 0.25 MG PO TABS: 0.2500 mg | ORAL_TABLET | Freq: Three times a day (TID) | ORAL | 2 refills | Status: DC | PRN

## 2018-09-04 NOTE — Progress Notes (Signed)
Virtual Visit via telephone Note  I connected with Dennis Melton on 09/04/18 at 0826 by telephone and verified that I am speaking with the correct person using two identifiers. Broderic Bara is currently located at home and no other people are currently with her during visit. The provider, Fransisca Kaufmann Erling Arrazola, MD is located in their office at time of visit.  Call ended at [phone]  I discussed the limitations, risks, security and privacy concerns of performing an evaluation and management service by telephone and the availability of in person appointments. I also discussed with the patient that there may be a patient responsible charge related to this service. The patient expressed understanding and agreed to proceed.   History and Present Illness: Type 2 diabetes mellitus Patient comes in today for recheck of his diabetes. Patient has been currently taking no medication. Patient is currently on an ACE inhibitor/ARB. Patient has not seen an ophthalmologist this year. Patient denies any issues with their feet.   Hypertension Patient is currently on amlodipine atenolol and valsartan-hydrochlorathiazide, and their blood pressure today is 110/53. Patient denies any lightheadedness or dizziness. Patient denies headaches, blurred vision, chest pains, shortness of breath, or weakness. Denies any side effects from medication and is content with current medication.   Anxiety  Patient still doing good even with coronavirus he says his anxiety has not buildup to be overwhelming.  He is very happy with his citalopram and Xanax and wants to continue forward with those.  He denies any suicidal ideations or thoughts of hurting himself.  No diagnosis found.  Outpatient Encounter Medications as of 09/04/2018  Medication Sig  . ALPRAZolam (XANAX) 0.25 MG tablet Take 1 tablet (0.25 mg total) by mouth 3 (three) times daily as needed.  Marland Kitchen amLODipine (NORVASC) 10 MG tablet Take 1 tablet (10 mg total) by mouth daily with  breakfast.  . aspirin EC 81 MG tablet Take 81 mg by mouth daily. Reported on 11/17/2015  . Aspirin-Caffeine 845-65 MG PACK Take 1 Package by mouth as needed (Pain).  Marland Kitchen atenolol (TENORMIN) 25 MG tablet TAKE 1 TABLET BY MOUTH ONCE DAILY IN THE MORNING  . atorvastatin (LIPITOR) 20 MG tablet Take 1 tablet (20 mg total) by mouth daily.  . cholecalciferol (VITAMIN D) 1000 units tablet Take 1,000 Units by mouth daily.  . citalopram (CELEXA) 40 MG tablet Take 1 tablet (40 mg total) by mouth daily.  . Omega-3 Fatty Acids (FISH OIL) 1200 MG CAPS Take 1 capsule by mouth 2 (two) times daily.  Marland Kitchen omeprazole (PRILOSEC) 20 MG capsule TAKE 1 CAPSULE BY MOUTH 2  TIMES DAILY BEFORE A MEAL.  . tamsulosin (FLOMAX) 0.4 MG CAPS capsule Take 1 capsule (0.4 mg total) by mouth daily.  . valsartan-hydrochlorothiazide (DIOVAN-HCT) 160-12.5 MG tablet Take 1 tablet by mouth daily.   No facility-administered encounter medications on file as of 09/04/2018.     Review of Systems  Constitutional: Negative for chills and fever.  Eyes: Negative for discharge.  Respiratory: Negative for shortness of breath and wheezing.   Cardiovascular: Negative for chest pain and leg swelling.  Musculoskeletal: Negative for back pain and gait problem.  Skin: Negative for rash.  All other systems reviewed and are negative.   Observations/Objective:   Assessment and Plan: Problem List Items Addressed This Visit      Cardiovascular and Mediastinum   HTN (hypertension) - Primary   Relevant Medications   atorvastatin (LIPITOR) 20 MG tablet     Other   HLD (hyperlipidemia)  Relevant Medications   atorvastatin (LIPITOR) 20 MG tablet   Generalized anxiety disorder   Relevant Medications   ALPRAZolam (XANAX) 0.25 MG tablet       Follow Up Instructions:  Follow up in 3 months  Continue current medication including Xanax, sent a refill for the Xanax and sent Lipitor to his mail order pharmacy.  Patient denies any other major  issues.   I discussed the assessment and treatment plan with the patient. The patient was provided an opportunity to ask questions and all were answered. The patient agreed with the plan and demonstrated an understanding of the instructions.   The patient was advised to call back or seek an in-person evaluation if the symptoms worsen or if the condition fails to improve as anticipated.  The above assessment and management plan was discussed with the patient. The patient verbalized understanding of and has agreed to the management plan. Patient is aware to call the clinic if symptoms persist or worsen. Patient is aware when to return to the clinic for a follow-up visit. Patient educated on when it is appropriate to go to the emergency department.    I provided 8 minutes of non-face-to-face time during this encounter.    Worthy Rancher, MD

## 2018-09-16 ENCOUNTER — Telehealth (INDEPENDENT_AMBULATORY_CARE_PROVIDER_SITE_OTHER): Payer: Self-pay | Admitting: *Deleted

## 2018-09-16 ENCOUNTER — Encounter (INDEPENDENT_AMBULATORY_CARE_PROVIDER_SITE_OTHER): Payer: Self-pay | Admitting: *Deleted

## 2018-09-16 NOTE — Telephone Encounter (Signed)
Patient needs suprep 

## 2018-09-17 MED ORDER — SUPREP BOWEL PREP KIT 17.5-3.13-1.6 GM/177ML PO SOLN: 1.0000 | Freq: Once | ORAL | 0 refills | Status: AC

## 2018-09-30 ENCOUNTER — Telehealth (INDEPENDENT_AMBULATORY_CARE_PROVIDER_SITE_OTHER): Payer: Self-pay | Admitting: *Deleted

## 2018-09-30 NOTE — Telephone Encounter (Signed)
agree

## 2018-09-30 NOTE — Telephone Encounter (Signed)
Referring MD/PCP: dettinger - [facility]   Procedure: tcs  Reason/Indication:  Hx polyps  Has patient had this procedure before?  Yes, 2014  If so, when, by whom and where?    Is there a family history of colon cancer?  no  Who?  What age when diagnosed?    Is patient diabetic?   no      Does patient have prosthetic heart valve or mechanical valve?  no  Do you have a pacemaker?  no  Has patient ever had endocarditis? no  Has patient had joint replacement within last 12 months?  no  Is patient constipated or do they take laxatives? no  Does patient have a history of alcohol/drug use?  no  Is patient on blood thinner such as Coumadin, Plavix and/or Aspirin? no  Medications: BC powder, atenolol 25 mg daily, citalopram 20 mg daily, omeprazole 20 mg daily, valsartan/hctz 160/12.5 mg daily, amlodipine 10 mg daily, tamsulosin 0.4 mg daily, atorvastatin 20 mg daily, vit d3 daily, omega fish oil daily  Allergies: nkda  Medication Adjustment per Dr Lindi Adie, NP: [facility] Powder 2 days  Procedure date & time: 10/16/18 at 730

## 2018-10-07 ENCOUNTER — Telehealth: Payer: Self-pay | Admitting: Family Medicine

## 2018-10-07 NOTE — Telephone Encounter (Signed)
Patient states he has been feeling dizzy with standing x 1 month . States he just took his bp and it was 117/54.  Patient states he does not have any other bp readings but he thinks he is taking too much bp medication.   Please advise

## 2018-10-08 NOTE — Telephone Encounter (Signed)
Patient aware of recommendation and verbalizes understanding. 

## 2018-10-08 NOTE — Telephone Encounter (Signed)
Go ahead and have him cut his amlodipine in half and take only 5 mg a day

## 2018-10-16 DIAGNOSIS — Z86010 Personal history of colonic polyps: Principal | ICD-10-CM

## 2018-10-22 ENCOUNTER — Telehealth: Payer: Self-pay | Admitting: Family Medicine

## 2018-11-03 ENCOUNTER — Other Ambulatory Visit: Payer: Self-pay

## 2018-11-03 NOTE — Patient Outreach (Signed)
[facility]) Care Management  11/03/2018  Jakhai Fant [birth year] [id number]   Medication Adherence call to Mr. Vash Quezada Hippa Identifiers Verify spoke with patient he is past due on Valsartan/Hctz 160/12.5 mg  Patient explain he is taking 1 tablet daily and never missed a dose patient has medication at this time and will order when due. Mr. Lard is showing past due under [place] Management Direct Dial [phone]  Fax [phone] [email or web address]

## 2018-11-07 DIAGNOSIS — R300 Dysuria: Secondary | ICD-10-CM | POA: Diagnosis not present

## 2018-11-07 DIAGNOSIS — K7460 Unspecified cirrhosis of liver: Secondary | ICD-10-CM | POA: Diagnosis not present

## 2018-11-07 DIAGNOSIS — N281 Cyst of kidney, acquired: Secondary | ICD-10-CM | POA: Diagnosis not present

## 2018-11-07 DIAGNOSIS — K5730 Diverticulosis of large intestine without perforation or abscess without bleeding: Secondary | ICD-10-CM | POA: Diagnosis not present

## 2018-11-07 DIAGNOSIS — Z7982 Long term (current) use of aspirin: Secondary | ICD-10-CM | POA: Diagnosis not present

## 2018-11-07 DIAGNOSIS — R310 Gross hematuria: Secondary | ICD-10-CM | POA: Diagnosis not present

## 2018-11-07 DIAGNOSIS — Z79899 Other long term (current) drug therapy: Secondary | ICD-10-CM | POA: Diagnosis not present

## 2018-11-10 DIAGNOSIS — R310 Gross hematuria: Secondary | ICD-10-CM | POA: Diagnosis not present

## 2018-12-01 ENCOUNTER — Telehealth: Payer: Self-pay | Admitting: Family Medicine

## 2018-12-01 NOTE — Telephone Encounter (Signed)
Patient states he has been having on and off hematuria x 1 month. Patient has been to the urologist and they states he has a blood clot or tumor and they are doing a procedure next month . Patient states he is now having trouble sleeping and has apt with Dettinger Demetrius Charity.  States he will wait till apt.

## 2018-12-03 ENCOUNTER — Other Ambulatory Visit: Payer: Self-pay

## 2018-12-04 ENCOUNTER — Ambulatory Visit (INDEPENDENT_AMBULATORY_CARE_PROVIDER_SITE_OTHER): Payer: Medicare Other | Admitting: Family Medicine

## 2018-12-04 ENCOUNTER — Encounter: Payer: Self-pay | Admitting: Family Medicine

## 2018-12-04 VITALS — BP 117/65 | HR 55 | Temp 99.3°F | Ht 73.0 in | Wt 190.6 lb

## 2018-12-04 DIAGNOSIS — I10 Essential (primary) hypertension: Secondary | ICD-10-CM | POA: Diagnosis not present

## 2018-12-04 DIAGNOSIS — E1169 Type 2 diabetes mellitus with other specified complication: Secondary | ICD-10-CM

## 2018-12-04 DIAGNOSIS — F411 Generalized anxiety disorder: Secondary | ICD-10-CM | POA: Diagnosis not present

## 2018-12-04 DIAGNOSIS — Z79891 Long term (current) use of opiate analgesic: Secondary | ICD-10-CM | POA: Diagnosis not present

## 2018-12-04 DIAGNOSIS — E782 Mixed hyperlipidemia: Secondary | ICD-10-CM

## 2018-12-04 LAB — BAYER DCA HB A1C WAIVED: HB A1C (BAYER DCA - WAIVED): 5.7 % (ref <7.0)

## 2018-12-04 MED ORDER — ALPRAZOLAM 0.25 MG PO TABS: 0.2500 mg | ORAL_TABLET | Freq: Three times a day (TID) | ORAL | 2 refills | Status: DC | PRN

## 2018-12-04 NOTE — Progress Notes (Addendum)
BP 117/65   Pulse (!) 55   Temp 99.3 F (37.4 C) (Temporal)   Ht _0  (1.854 m)   Wt 190 lb 9.6 oz (86.5 kg)   BMI 25.15 kg/m    Subjective:   Patient ID: Dennis Melton, male    DOB: [birth year], 78 y.o.   MRN: [id number]  HPI: Dennis Melton is a 78 y.o. male presenting on 12/04/2018 for Diabetes (3 month follow up) and Hypertension   HPI Type 2 diabetes mellitus Patient comes in today for recheck of his diabetes. Patient has been currently taking no medication currently and his last A1c was in the fives, we are monitoring and he is diet controlled. Patient is currently on an ACE inhibitor/ARB. Patient has not seen an ophthalmologist this year. Patient denies any issues with their feet.   Hypertension Patient is currently on atenolol and amlodipine and valsartan hydrochlorothiazide, and their blood pressure today is 117/65. Patient denies any lightheadedness or dizziness. Patient denies headaches, blurred vision, chest pains, shortness of breath, or weakness. Denies any side effects from medication and is content with current medication.   Hyperlipidemia Patient is coming in for recheck of his hyperlipidemia. The patient is currently taking atorvastatin. They deny any issues with myalgias or history of liver damage from it. They deny any focal numbness or weakness or chest pain.   Anxiety disorder Patient is coming in for anxiety.  He currently takes citalopram that he takes alprazolam anywhere between 1-3 at nighttime.  He says it works will help with anxiety management out for a few days and has been having issues with it over the past few days.  He says especially since right now he is going through with a urologist and they have some suspicions for possible bladder cancer.  Patient denies any suicidal ideations or thoughts of hurting himself. Pain assessment: Current rx-alprazolam 0.25 3 times daily as needed # meds rx-90 Effectiveness of current meds-works well when he has it and  does not work as well when he does not have it Adverse reactions form pain meds-none  Pill count performed-No Last drug screen -NA ( high risk q39m moderate risk q657mlow risk yearly ) Urine drug screen today- Yes Was the [place]yes  If yes were their any concerning findings? -None  No flowsheet data found.  Pain contract signed on: 12/01/2018  Relevant past medical, surgical, family and social history reviewed and updated as indicated. Interim medical history since our last visit reviewed. Allergies and medications reviewed and updated.  Review of Systems  Constitutional: Negative for chills and fever.  Respiratory: Negative for shortness of breath and wheezing.   Cardiovascular: Negative for chest pain and leg swelling.  Musculoskeletal: Negative for back pain and gait problem.  Skin: Negative for rash.  Neurological: Negative for dizziness, weakness and numbness.  Psychiatric/Behavioral: Positive for sleep disturbance. Negative for decreased concentration, dysphoric mood, self-injury and suicidal ideas. The patient is nervous/anxious.   All other systems reviewed and are negative.   Per HPI unless specifically indicated above   Allergies as of 12/04/2018   No Known Allergies     Medication List       Accurate as of December 04, 2018  8:37 AM. If you have any questions, ask your nurse or doctor.        ALPRAZolam 0.25 MG tablet Commonly known as: XANAX Take 1 tablet (0.25 mg total) by mouth 3 (three) times daily as needed.   amLODipine 10 MG tablet Commonly  known as: NORVASC Take 1 tablet (10 mg total) by mouth daily with breakfast.   aspirin EC 81 MG tablet Take 81 mg by mouth daily. Reported on 11/17/2015   Aspirin-Caffeine 845-65 MG Pack Take 1 Package by mouth as needed (Pain).   atenolol 25 MG tablet Commonly known as: TENORMIN TAKE 1 TABLET BY MOUTH ONCE DAILY IN THE MORNING   atorvastatin 20 MG tablet Commonly known as: LIPITOR Take 1 tablet (20  mg total) by mouth daily.   cholecalciferol 1000 units tablet Commonly known as: VITAMIN D Take 1,000 Units by mouth daily.   citalopram 40 MG tablet Commonly known as: CELEXA Take 1 tablet (40 mg total) by mouth daily.   Fish Oil 1200 MG Caps Take 1 capsule by mouth 2 (two) times daily.   omeprazole 20 MG capsule Commonly known as: PRILOSEC TAKE 1 CAPSULE BY MOUTH 2  TIMES DAILY BEFORE A MEAL.   tamsulosin 0.4 MG Caps capsule Commonly known as: FLOMAX Take 1 capsule (0.4 mg total) by mouth daily.   valsartan-hydrochlorothiazide 160-12.5 MG tablet Commonly known as: DIOVAN-HCT Take 1 tablet by mouth daily.        Objective:   BP 117/65   Pulse (!) 55   Temp 99.3 F (37.4 C) (Temporal)   Ht _0  (1.854 m)   Wt 190 lb 9.6 oz (86.5 kg)   BMI 25.15 kg/m   Wt Readings from Last 3 Encounters:  12/04/18 190 lb 9.6 oz (86.5 kg)  05/29/18 187 lb (84.8 kg)  05/16/18 187 lb 6.4 oz (85 kg)    Physical Exam Vitals signs and nursing note reviewed.  Constitutional:      General: He is not in acute distress.    Appearance: He is well-developed. He is not diaphoretic.  Eyes:     General: No scleral icterus.    Conjunctiva/sclera: Conjunctivae normal.  Neck:     Musculoskeletal: Neck supple.     Thyroid: No thyromegaly.  Cardiovascular:     Rate and Rhythm: Normal rate and regular rhythm.     Heart sounds: Murmur present. Crescendo  decrescendo  systolic murmur present with a grade of 3/6.  Pulmonary:     Effort: Pulmonary effort is normal. No respiratory distress.     Breath sounds: Normal breath sounds. No wheezing.  Musculoskeletal: Normal range of motion.  Lymphadenopathy:     Cervical: No cervical adenopathy.  Skin:    General: Skin is warm and dry.     Findings: No rash.  Neurological:     Mental Status: He is alert and oriented to person, place, and time.     Coordination: Coordination normal.  Psychiatric:        Behavior: Behavior normal.        Assessment & Plan:   Problem List Items Addressed This Visit      Cardiovascular and Mediastinum   HTN (hypertension)   Relevant Orders   CBC with Differential/Platelet   CMP14+EGFR   TSH     Endocrine   T2DM (type 2 diabetes mellitus) ([place]) - Primary   Relevant Orders   CBC with Differential/Platelet   CMP14+EGFR   Bayer DCA Hb A1c Waived   TSH     Other   HLD (hyperlipidemia)   Relevant Orders   Lipid panel   Generalized anxiety disorder   Relevant Medications   ALPRAZolam (XANAX) 0.25 MG tablet   Other Relevant Orders   CBC with Differential/Platelet   ToxASSURE Select 13 (MW), Urine  TSH      Discussed with the patient switching from Xanax to trazodone and he will research it and let us know in the future.  We will check blood work and continue current medications for blood pressure and cholesterol Follow up plan: Return in about 3 months (around 03/06/2019), or if symptoms worsen or fail to improve, for Anxiety and cholesterol diabetes recheck.  Counseling provided for all of the vaccine components Orders Placed This Encounter  Procedures  . CBC with Differential/Platelet  . CMP14+EGFR  . Bayer DCA Hb A1c Waived  . Lipid panel  . ToxASSURE Select 13 (MW), Urine  . TSH    Caryl Pina, MD [facility] 12/04/2018, 8:37 AM

## 2018-12-05 LAB — TSH: TSH: 0.419 u[IU]/mL — ABNORMAL LOW (ref 0.450–4.500)

## 2018-12-05 LAB — CBC WITH DIFFERENTIAL/PLATELET
Basophils Absolute: 0.1 10*3/uL (ref 0.0–0.2)
Basos: 1 %
EOS (ABSOLUTE): 0.2 10*3/uL (ref 0.0–0.4)
Eos: 4 %
Hematocrit: 34.1 % — ABNORMAL LOW (ref 37.5–51.0)
Hemoglobin: 11.9 g/dL — ABNORMAL LOW (ref 13.0–17.7)
Immature Grans (Abs): 0 10*3/uL (ref 0.0–0.1)
Immature Granulocytes: 0 %
Lymphocytes Absolute: 1.5 10*3/uL (ref 0.7–3.1)
Lymphs: 26 %
MCH: 28.5 pg (ref 26.6–33.0)
MCHC: 34.9 g/dL (ref 31.5–35.7)
MCV: 82 fL (ref 79–97)
Monocytes Absolute: 0.6 10*3/uL (ref 0.1–0.9)
Monocytes: 10 %
Neutrophils Absolute: 3.6 10*3/uL (ref 1.4–7.0)
Neutrophils: 59 %
Platelets: 171 10*3/uL (ref 150–450)
RBC: 4.17 x10E6/uL (ref 4.14–5.80)
RDW: 13.9 % (ref 11.6–15.4)
WBC: 6 10*3/uL (ref 3.4–10.8)

## 2018-12-05 LAB — CMP14+EGFR
ALT: 15 IU/L (ref 0–44)
AST: 17 IU/L (ref 0–40)
Albumin/Globulin Ratio: 1.8 (ref 1.2–2.2)
Albumin: 4.7 g/dL (ref 3.7–4.7)
Alkaline Phosphatase: 136 IU/L — ABNORMAL HIGH (ref 39–117)
BUN/Creatinine Ratio: 10 (ref 10–24)
BUN: 11 mg/dL (ref 8–27)
Bilirubin Total: 0.5 mg/dL (ref 0.0–1.2)
CO2: 22 mmol/L (ref 20–29)
Calcium: 9.4 mg/dL (ref 8.6–10.2)
Chloride: 96 mmol/L (ref 96–106)
Creatinine, Ser: 1.1 mg/dL (ref 0.76–1.27)
GFR calc Af Amer: 74 mL/min/{1.73_m2} (ref >59)
GFR calc non Af Amer: 64 mL/min/{1.73_m2} (ref >59)
Globulin, Total: 2.6 g/dL (ref 1.5–4.5)
Glucose: 119 mg/dL — ABNORMAL HIGH (ref 65–99)
Potassium: 4.4 mmol/L (ref 3.5–5.2)
Sodium: 136 mmol/L (ref 134–144)
Total Protein: 7.3 g/dL (ref 6.0–8.5)

## 2018-12-05 LAB — LIPID PANEL
Chol/HDL Ratio: 2.4 ratio (ref 0.0–5.0)
Cholesterol, Total: 106 mg/dL (ref 100–199)
HDL: 45 mg/dL (ref >39)
LDL Calculated: 48 mg/dL (ref 0–99)
Triglycerides: 64 mg/dL (ref 0–149)
VLDL Cholesterol Cal: 13 mg/dL (ref 5–40)

## 2018-12-09 DIAGNOSIS — R319 Hematuria, unspecified: Secondary | ICD-10-CM | POA: Diagnosis not present

## 2018-12-09 DIAGNOSIS — Z01818 Encounter for other preprocedural examination: Secondary | ICD-10-CM | POA: Diagnosis not present

## 2018-12-09 DIAGNOSIS — I350 Nonrheumatic aortic (valve) stenosis: Secondary | ICD-10-CM | POA: Diagnosis not present

## 2018-12-09 DIAGNOSIS — Z1159 Encounter for screening for other viral diseases: Secondary | ICD-10-CM | POA: Diagnosis not present

## 2018-12-09 DIAGNOSIS — I10 Essential (primary) hypertension: Secondary | ICD-10-CM | POA: Diagnosis not present

## 2018-12-09 DIAGNOSIS — R9431 Abnormal electrocardiogram [ECG] [EKG]: Secondary | ICD-10-CM | POA: Diagnosis not present

## 2018-12-09 DIAGNOSIS — Z01812 Encounter for preprocedural laboratory examination: Secondary | ICD-10-CM | POA: Diagnosis not present

## 2018-12-09 LAB — TOXASSURE SELECT 13 (MW), URINE

## 2018-12-16 ENCOUNTER — Encounter: Payer: Self-pay | Admitting: Family Medicine

## 2018-12-16 DIAGNOSIS — Z01810 Encounter for preprocedural cardiovascular examination: Secondary | ICD-10-CM | POA: Diagnosis not present

## 2018-12-16 DIAGNOSIS — Z01818 Encounter for other preprocedural examination: Secondary | ICD-10-CM | POA: Diagnosis not present

## 2018-12-16 DIAGNOSIS — I083 Combined rheumatic disorders of mitral, aortic and tricuspid valves: Secondary | ICD-10-CM | POA: Diagnosis not present

## 2018-12-16 DIAGNOSIS — R9431 Abnormal electrocardiogram [ECG] [EKG]: Secondary | ICD-10-CM | POA: Diagnosis not present

## 2018-12-16 DIAGNOSIS — I519 Heart disease, unspecified: Secondary | ICD-10-CM | POA: Diagnosis not present

## 2018-12-16 DIAGNOSIS — I517 Cardiomegaly: Secondary | ICD-10-CM | POA: Diagnosis not present

## 2018-12-18 DIAGNOSIS — R9431 Abnormal electrocardiogram [ECG] [EKG]: Secondary | ICD-10-CM | POA: Diagnosis not present

## 2018-12-18 DIAGNOSIS — Z720 Tobacco use: Secondary | ICD-10-CM | POA: Diagnosis not present

## 2018-12-18 DIAGNOSIS — I350 Nonrheumatic aortic (valve) stenosis: Secondary | ICD-10-CM | POA: Diagnosis not present

## 2018-12-18 DIAGNOSIS — R9439 Abnormal result of other cardiovascular function study: Secondary | ICD-10-CM | POA: Diagnosis not present

## 2018-12-18 DIAGNOSIS — R001 Bradycardia, unspecified: Secondary | ICD-10-CM | POA: Diagnosis not present

## 2018-12-18 DIAGNOSIS — I2510 Atherosclerotic heart disease of native coronary artery without angina pectoris: Secondary | ICD-10-CM | POA: Diagnosis not present

## 2018-12-18 DIAGNOSIS — I70769 Atherosclerosis of other type of bypass graft(s) of the extremities with gangrene, unspecified extremity: Secondary | ICD-10-CM | POA: Diagnosis not present

## 2018-12-18 DIAGNOSIS — I7025 Atherosclerosis of native arteries of other extremities with ulceration: Secondary | ICD-10-CM | POA: Diagnosis not present

## 2018-12-18 DIAGNOSIS — I739 Peripheral vascular disease, unspecified: Secondary | ICD-10-CM | POA: Diagnosis not present

## 2018-12-19 DIAGNOSIS — I70769 Atherosclerosis of other type of bypass graft(s) of the extremities with gangrene, unspecified extremity: Secondary | ICD-10-CM | POA: Diagnosis not present

## 2018-12-19 DIAGNOSIS — I350 Nonrheumatic aortic (valve) stenosis: Secondary | ICD-10-CM | POA: Diagnosis not present

## 2018-12-19 DIAGNOSIS — R9439 Abnormal result of other cardiovascular function study: Secondary | ICD-10-CM | POA: Diagnosis not present

## 2018-12-19 DIAGNOSIS — Z01812 Encounter for preprocedural laboratory examination: Secondary | ICD-10-CM | POA: Diagnosis not present

## 2018-12-19 DIAGNOSIS — I739 Peripheral vascular disease, unspecified: Secondary | ICD-10-CM | POA: Diagnosis not present

## 2018-12-19 DIAGNOSIS — I2510 Atherosclerotic heart disease of native coronary artery without angina pectoris: Secondary | ICD-10-CM | POA: Diagnosis not present

## 2018-12-23 ENCOUNTER — Encounter (INDEPENDENT_AMBULATORY_CARE_PROVIDER_SITE_OTHER): Payer: Self-pay | Admitting: *Deleted

## 2018-12-23 DIAGNOSIS — R319 Hematuria, unspecified: Secondary | ICD-10-CM | POA: Diagnosis not present

## 2018-12-23 DIAGNOSIS — I10 Essential (primary) hypertension: Secondary | ICD-10-CM | POA: Diagnosis not present

## 2018-12-23 DIAGNOSIS — I350 Nonrheumatic aortic (valve) stenosis: Secondary | ICD-10-CM | POA: Diagnosis not present

## 2018-12-23 DIAGNOSIS — I358 Other nonrheumatic aortic valve disorders: Secondary | ICD-10-CM | POA: Diagnosis not present

## 2018-12-23 DIAGNOSIS — I2510 Atherosclerotic heart disease of native coronary artery without angina pectoris: Secondary | ICD-10-CM | POA: Diagnosis not present

## 2018-12-23 DIAGNOSIS — K219 Gastro-esophageal reflux disease without esophagitis: Secondary | ICD-10-CM | POA: Diagnosis not present

## 2018-12-23 DIAGNOSIS — Z79899 Other long term (current) drug therapy: Secondary | ICD-10-CM | POA: Diagnosis not present

## 2018-12-23 DIAGNOSIS — E785 Hyperlipidemia, unspecified: Secondary | ICD-10-CM | POA: Diagnosis not present

## 2018-12-25 DIAGNOSIS — I739 Peripheral vascular disease, unspecified: Secondary | ICD-10-CM | POA: Diagnosis not present

## 2018-12-31 DIAGNOSIS — Z720 Tobacco use: Secondary | ICD-10-CM | POA: Diagnosis not present

## 2018-12-31 DIAGNOSIS — I2510 Atherosclerotic heart disease of native coronary artery without angina pectoris: Secondary | ICD-10-CM | POA: Diagnosis not present

## 2018-12-31 DIAGNOSIS — I350 Nonrheumatic aortic (valve) stenosis: Secondary | ICD-10-CM | POA: Diagnosis not present

## 2019-01-01 DIAGNOSIS — Z01812 Encounter for preprocedural laboratory examination: Secondary | ICD-10-CM | POA: Diagnosis not present

## 2019-01-01 DIAGNOSIS — N329 Bladder disorder, unspecified: Secondary | ICD-10-CM | POA: Diagnosis not present

## 2019-01-01 DIAGNOSIS — R310 Gross hematuria: Secondary | ICD-10-CM | POA: Diagnosis not present

## 2019-01-05 DIAGNOSIS — Z79899 Other long term (current) drug therapy: Secondary | ICD-10-CM | POA: Diagnosis not present

## 2019-01-05 DIAGNOSIS — K219 Gastro-esophageal reflux disease without esophagitis: Secondary | ICD-10-CM | POA: Diagnosis not present

## 2019-01-05 DIAGNOSIS — E785 Hyperlipidemia, unspecified: Secondary | ICD-10-CM | POA: Diagnosis not present

## 2019-01-05 DIAGNOSIS — I350 Nonrheumatic aortic (valve) stenosis: Secondary | ICD-10-CM | POA: Diagnosis not present

## 2019-01-05 DIAGNOSIS — I10 Essential (primary) hypertension: Secondary | ICD-10-CM | POA: Diagnosis not present

## 2019-01-05 DIAGNOSIS — I2510 Atherosclerotic heart disease of native coronary artery without angina pectoris: Secondary | ICD-10-CM | POA: Diagnosis not present

## 2019-01-05 DIAGNOSIS — R310 Gross hematuria: Secondary | ICD-10-CM | POA: Diagnosis not present

## 2019-01-05 DIAGNOSIS — R319 Hematuria, unspecified: Secondary | ICD-10-CM | POA: Diagnosis not present

## 2019-01-12 ENCOUNTER — Ambulatory Visit (INDEPENDENT_AMBULATORY_CARE_PROVIDER_SITE_OTHER): Payer: Self-pay

## 2019-01-12 ENCOUNTER — Other Ambulatory Visit: Payer: Self-pay

## 2019-01-12 ENCOUNTER — Telehealth (INDEPENDENT_AMBULATORY_CARE_PROVIDER_SITE_OTHER): Payer: Self-pay | Admitting: *Deleted

## 2019-01-12 NOTE — Telephone Encounter (Signed)
Referring MD/PCP: dettinger   Procedure: tcs  Reason/Indication:  Hx polyps  Has patient had this procedure before?  Yes, 2014  If so, when, by whom and where?    Is there a family history of colon cancer?  no  Who?  What age when diagnosed?    Is patient diabetic?   no      Does patient have prosthetic heart valve or mechanical valve?  no  Do you have a pacemaker/defibrillator?  no  Has patient ever had endocarditis/atrial fibrillation? no  Does patient use oxygen? no  Has patient had joint replacement within last 12 months?  no  Is patient constipated or do they take laxatives? no  Does patient have a history of alcohol/drug use?  no  Is patient on blood thinner such as Coumadin, Plavix and/or Aspirin? on  Medications: BC powder, atenolol 25 mg daily, omeprazole 20 mg daily,m valsartan/hctz 160/12.5 mg daily, amlodipine 10 mg daily, tamsulosin 0.4 mg daily, atorvastatin 20 mg daily, vit d3 daily, omega fish oil daily  Allergies: nkda  Medication Adjustment per Dr Charlena Cross, NP: [facility] powder 2 days  Procedure date & time: 02/11/19 at 830

## 2019-01-21 NOTE — Telephone Encounter (Signed)
Okay to schedule colonoscopy with conscious sedation 

## 2019-01-23 DIAGNOSIS — I350 Nonrheumatic aortic (valve) stenosis: Secondary | ICD-10-CM | POA: Diagnosis not present

## 2019-01-23 DIAGNOSIS — I10 Essential (primary) hypertension: Secondary | ICD-10-CM | POA: Diagnosis not present

## 2019-02-11 ENCOUNTER — Encounter (HOSPITAL_COMMUNITY): Admission: RE | Payer: Self-pay | Source: Home / Self Care

## 2019-02-11 ENCOUNTER — Ambulatory Visit (HOSPITAL_COMMUNITY): Admission: RE | Admit: 2019-02-11 | Payer: Medicare Other | Source: Home / Self Care | Admitting: Internal Medicine

## 2019-02-11 SURGERY — COLONOSCOPY
Anesthesia: Moderate Sedation

## 2019-02-17 ENCOUNTER — Other Ambulatory Visit: Payer: Self-pay

## 2019-02-17 NOTE — Patient Outreach (Signed)
[facility]) Care Management  02/17/2019  Dennis Melton 16-Aug-1940 [id number]   Medication Adherence call to Mr. Dennis Melton Hippa Identifiers Verify spoke with patient he is past due on Metformin 500 mg patient explain he is no longer taking this medication doctor took him off. Dennis Melton is showing past due under [place].   [place] Management Direct Dial [phone]  Fax [phone] [email or web address]

## 2019-02-19 ENCOUNTER — Other Ambulatory Visit: Payer: Self-pay | Admitting: Family Medicine

## 2019-02-19 DIAGNOSIS — F411 Generalized anxiety disorder: Secondary | ICD-10-CM

## 2019-02-19 DIAGNOSIS — I10 Essential (primary) hypertension: Secondary | ICD-10-CM

## 2019-02-20 DIAGNOSIS — Z01812 Encounter for preprocedural laboratory examination: Secondary | ICD-10-CM | POA: Diagnosis not present

## 2019-02-20 DIAGNOSIS — R0600 Dyspnea, unspecified: Secondary | ICD-10-CM | POA: Diagnosis not present

## 2019-02-20 DIAGNOSIS — E785 Hyperlipidemia, unspecified: Secondary | ICD-10-CM | POA: Diagnosis not present

## 2019-02-20 DIAGNOSIS — R0609 Other forms of dyspnea: Secondary | ICD-10-CM | POA: Diagnosis not present

## 2019-02-20 DIAGNOSIS — I350 Nonrheumatic aortic (valve) stenosis: Secondary | ICD-10-CM | POA: Diagnosis not present

## 2019-02-20 DIAGNOSIS — I10 Essential (primary) hypertension: Secondary | ICD-10-CM | POA: Diagnosis not present

## 2019-02-20 DIAGNOSIS — I2510 Atherosclerotic heart disease of native coronary artery without angina pectoris: Secondary | ICD-10-CM | POA: Diagnosis not present

## 2019-02-24 DIAGNOSIS — I350 Nonrheumatic aortic (valve) stenosis: Secondary | ICD-10-CM | POA: Diagnosis not present

## 2019-02-24 DIAGNOSIS — E785 Hyperlipidemia, unspecified: Secondary | ICD-10-CM | POA: Diagnosis not present

## 2019-02-24 DIAGNOSIS — I10 Essential (primary) hypertension: Secondary | ICD-10-CM | POA: Diagnosis not present

## 2019-02-24 DIAGNOSIS — R0600 Dyspnea, unspecified: Secondary | ICD-10-CM | POA: Diagnosis not present

## 2019-02-24 DIAGNOSIS — Z01810 Encounter for preprocedural cardiovascular examination: Secondary | ICD-10-CM | POA: Diagnosis not present

## 2019-02-24 DIAGNOSIS — I2510 Atherosclerotic heart disease of native coronary artery without angina pectoris: Secondary | ICD-10-CM | POA: Diagnosis not present

## 2019-02-24 DIAGNOSIS — I70203 Unspecified atherosclerosis of native arteries of extremities, bilateral legs: Secondary | ICD-10-CM | POA: Diagnosis not present

## 2019-02-24 DIAGNOSIS — Z01812 Encounter for preprocedural laboratory examination: Secondary | ICD-10-CM | POA: Diagnosis not present

## 2019-02-24 DIAGNOSIS — I080 Rheumatic disorders of both mitral and aortic valves: Secondary | ICD-10-CM | POA: Diagnosis not present

## 2019-02-24 DIAGNOSIS — I700 Atherosclerosis of aorta: Secondary | ICD-10-CM | POA: Diagnosis not present

## 2019-02-24 DIAGNOSIS — Z87891 Personal history of nicotine dependence: Secondary | ICD-10-CM | POA: Diagnosis not present

## 2019-02-24 DIAGNOSIS — I359 Nonrheumatic aortic valve disorder, unspecified: Secondary | ICD-10-CM | POA: Diagnosis not present

## 2019-03-06 ENCOUNTER — Ambulatory Visit (INDEPENDENT_AMBULATORY_CARE_PROVIDER_SITE_OTHER): Payer: Medicare Other | Admitting: Family Medicine

## 2019-03-06 ENCOUNTER — Encounter: Payer: Self-pay | Admitting: Family Medicine

## 2019-03-06 DIAGNOSIS — E782 Mixed hyperlipidemia: Secondary | ICD-10-CM | POA: Diagnosis not present

## 2019-03-06 DIAGNOSIS — E1169 Type 2 diabetes mellitus with other specified complication: Secondary | ICD-10-CM | POA: Diagnosis not present

## 2019-03-06 DIAGNOSIS — I10 Essential (primary) hypertension: Secondary | ICD-10-CM

## 2019-03-06 DIAGNOSIS — F411 Generalized anxiety disorder: Secondary | ICD-10-CM | POA: Diagnosis not present

## 2019-03-06 MED ORDER — ALPRAZOLAM 0.25 MG PO TABS: 0.2500 mg | ORAL_TABLET | Freq: Three times a day (TID) | ORAL | 2 refills | Status: DC | PRN

## 2019-03-06 MED ORDER — FINASTERIDE 5 MG PO TABS: 5.0000 mg | ORAL_TABLET | Freq: Every day | ORAL | 3 refills | Status: DC

## 2019-03-06 NOTE — Progress Notes (Signed)
Virtual Visit via telephone Note  I connected with Dennis Melton on 03/06/19 at [place] by telephone and verified that I am speaking with the correct person using two identifiers. Dennis Melton is currently located at home and no other people are currently with her during visit. The provider, Fransisca Kaufmann Nesta Scaturro, MD is located in their office at time of visit.  Call ended at [phone]  I discussed the limitations, risks, security and privacy concerns of performing an evaluation and management service by telephone and the availability of in person appointments. I also discussed with the patient that there may be a patient responsible charge related to this service. The patient expressed understanding and agreed to proceed.   History and Present Illness: Anxiety and stress Patient is calling in for anxiety and stress recheck and is taking alprazolam and citalopram. Patient denies any suicidal ideations or thoughts of hurting himself. Current rx- alprazolam 0.25 tid PRN # meds rx- 90 Effectiveness of current meds-working well Adverse reactions form meds-none Pill count performed-No Last drug screen - 12/08/18 ( high risk q81m, moderate risk q67m, low risk yearly ) Urine drug screen today- No Was the [place] reviewed- yes  If yes were their any concerning findings? - none  No flowsheet data found.   Pain contract signed on: 12/08/18  Type 2 diabetes mellitus Patient comes in today for recheck of his diabetes. Patient has been currently taking no medication. Patient is currently on an ACE inhibitor/ARB. Patient has not seen an ophthalmologist this year. Patient denies any issues with their feet.   Hypertension Patient is currently on amlodipine and atenolol and valsartan hct, and their blood pressure today is 120/80. Patient denies any lightheadedness or dizziness. Patient denies headaches, blurred vision, chest pains, shortness of breath, or weakness. Denies any side effects from medication and is  content with current medication.    Hyperlipidemia Patient is coming in for recheck of his hyperlipidemia. The patient is currently taking atorvastatin and fish oils. They deny any issues with myalgias or history of liver damage from it. They deny any focal numbness or weakness or chest pain.   No diagnosis found.  Outpatient Encounter Medications as of 03/06/2019  Medication Sig  . ALPRAZolam (XANAX) 0.25 MG tablet Take 1 tablet (0.25 mg total) by mouth 3 (three) times daily as needed.  Marland Kitchen amLODipine (NORVASC) 10 MG tablet TAKE 1 TABLET BY MOUTH  DAILY WITH BREAKFAST  . aspirin EC 81 MG tablet Take 81 mg by mouth daily. Reported on 11/17/2015  . Aspirin-Caffeine 845-65 MG PACK Take 1 Package by mouth as needed (Pain).  Marland Kitchen atenolol (TENORMIN) 25 MG tablet TAKE 1 TABLET BY MOUTH ONCE DAILY IN THE MORNING  . atorvastatin (LIPITOR) 20 MG tablet Take 1 tablet (20 mg total) by mouth daily.  . cholecalciferol (VITAMIN D) 1000 units tablet Take 1,000 Units by mouth daily.  . citalopram (CELEXA) 40 MG tablet TAKE 1 TABLET BY MOUTH  DAILY  . Omega-3 Fatty Acids (FISH OIL) 1200 MG CAPS Take 1 capsule by mouth 2 (two) times daily.  Marland Kitchen omeprazole (PRILOSEC) 20 MG capsule TAKE 1 CAPSULE BY MOUTH 2  TIMES DAILY BEFORE A MEAL.  . tamsulosin (FLOMAX) 0.4 MG CAPS capsule TAKE 1 CAPSULE BY MOUTH  DAILY  . valsartan-hydrochlorothiazide (DIOVAN-HCT) 160-12.5 MG tablet TAKE 1 TABLET BY MOUTH  DAILY   No facility-administered encounter medications on file as of 03/06/2019.     Review of Systems  Constitutional: Negative for chills and fever.  Respiratory:  Negative for shortness of breath and wheezing.   Cardiovascular: Negative for chest pain and leg swelling.  Musculoskeletal: Negative for back pain and gait problem.  Skin: Negative for rash.  Neurological: Negative for dizziness, weakness and light-headedness.  All other systems reviewed and are negative.   Observations/Objective: Patient sounds  comfortable and in no acute distress  Assessment and Plan: Problem List Items Addressed This Visit      Cardiovascular and Mediastinum   HTN (hypertension)     Endocrine   T2DM (type 2 diabetes mellitus) ([place]) - Primary     Other   HLD (hyperlipidemia)   Generalized anxiety disorder   Relevant Medications   ALPRAZolam (XANAX) 0.25 MG tablet       Follow Up Instructions:  Recheck in 3 months and htn   I discussed the assessment and treatment plan with the patient. The patient was provided an opportunity to ask questions and all were answered. The patient agreed with the plan and demonstrated an understanding of the instructions.   The patient was advised to call back or seek an in-person evaluation if the symptoms worsen or if the condition fails to improve as anticipated.  The above assessment and management plan was discussed with the patient. The patient verbalized understanding of and has agreed to the management plan. Patient is aware to call the clinic if symptoms persist or worsen. Patient is aware when to return to the clinic for a follow-up visit. Patient educated on when it is appropriate to go to the emergency department.    I provided 18 minutes of non-face-to-face time during this encounter.    Worthy Rancher, MD

## 2019-03-26 DIAGNOSIS — I350 Nonrheumatic aortic (valve) stenosis: Secondary | ICD-10-CM | POA: Diagnosis not present

## 2019-04-09 ENCOUNTER — Other Ambulatory Visit: Payer: Self-pay | Admitting: Family Medicine

## 2019-04-09 DIAGNOSIS — E1169 Type 2 diabetes mellitus with other specified complication: Secondary | ICD-10-CM

## 2019-04-10 DIAGNOSIS — Z01818 Encounter for other preprocedural examination: Secondary | ICD-10-CM | POA: Diagnosis not present

## 2019-04-10 DIAGNOSIS — I10 Essential (primary) hypertension: Secondary | ICD-10-CM | POA: Diagnosis not present

## 2019-04-10 DIAGNOSIS — Z01810 Encounter for preprocedural cardiovascular examination: Secondary | ICD-10-CM | POA: Diagnosis not present

## 2019-04-10 DIAGNOSIS — I350 Nonrheumatic aortic (valve) stenosis: Secondary | ICD-10-CM | POA: Diagnosis not present

## 2019-04-10 DIAGNOSIS — Z01812 Encounter for preprocedural laboratory examination: Secondary | ICD-10-CM | POA: Diagnosis not present

## 2019-04-10 DIAGNOSIS — I447 Left bundle-branch block, unspecified: Secondary | ICD-10-CM | POA: Diagnosis not present

## 2019-04-13 NOTE — Telephone Encounter (Signed)
I think the patient had been discontinued from the Metformin because his A1c was 5.7 if I recall.  If we want to verify we may need to ask the patient for sure.  If he still wants the Metformin then I am fine refilling it but I do not know that he needs it.

## 2019-04-14 DIAGNOSIS — I071 Rheumatic tricuspid insufficiency: Secondary | ICD-10-CM | POA: Diagnosis not present

## 2019-04-14 DIAGNOSIS — I350 Nonrheumatic aortic (valve) stenosis: Secondary | ICD-10-CM | POA: Diagnosis not present

## 2019-04-14 DIAGNOSIS — Z954 Presence of other heart-valve replacement: Secondary | ICD-10-CM | POA: Diagnosis not present

## 2019-04-14 DIAGNOSIS — I2510 Atherosclerotic heart disease of native coronary artery without angina pectoris: Secondary | ICD-10-CM | POA: Diagnosis not present

## 2019-04-14 DIAGNOSIS — Z79899 Other long term (current) drug therapy: Secondary | ICD-10-CM | POA: Diagnosis not present

## 2019-04-14 DIAGNOSIS — I519 Heart disease, unspecified: Secondary | ICD-10-CM | POA: Diagnosis not present

## 2019-04-14 DIAGNOSIS — R918 Other nonspecific abnormal finding of lung field: Secondary | ICD-10-CM | POA: Diagnosis not present

## 2019-04-14 DIAGNOSIS — K219 Gastro-esophageal reflux disease without esophagitis: Secondary | ICD-10-CM | POA: Diagnosis not present

## 2019-04-14 DIAGNOSIS — I10 Essential (primary) hypertension: Secondary | ICD-10-CM | POA: Diagnosis not present

## 2019-04-14 DIAGNOSIS — E785 Hyperlipidemia, unspecified: Secondary | ICD-10-CM | POA: Diagnosis not present

## 2019-04-14 DIAGNOSIS — R0989 Other specified symptoms and signs involving the circulatory and respiratory systems: Secondary | ICD-10-CM | POA: Diagnosis not present

## 2019-04-14 DIAGNOSIS — I517 Cardiomegaly: Secondary | ICD-10-CM | POA: Diagnosis not present

## 2019-04-14 DIAGNOSIS — Z48812 Encounter for surgical aftercare following surgery on the circulatory system: Secondary | ICD-10-CM | POA: Diagnosis not present

## 2019-04-14 DIAGNOSIS — Z87891 Personal history of nicotine dependence: Secondary | ICD-10-CM | POA: Diagnosis not present

## 2019-04-14 DIAGNOSIS — J9811 Atelectasis: Secondary | ICD-10-CM | POA: Diagnosis not present

## 2019-04-14 DIAGNOSIS — Z006 Encounter for examination for normal comparison and control in clinical research program: Secondary | ICD-10-CM | POA: Diagnosis not present

## 2019-04-14 DIAGNOSIS — Z952 Presence of prosthetic heart valve: Secondary | ICD-10-CM | POA: Diagnosis not present

## 2019-04-14 DIAGNOSIS — J939 Pneumothorax, unspecified: Secondary | ICD-10-CM | POA: Diagnosis not present

## 2019-04-14 HISTORY — PX: AORTIC VALVE REPLACEMENT: SHX41

## 2019-04-15 DIAGNOSIS — J939 Pneumothorax, unspecified: Secondary | ICD-10-CM | POA: Diagnosis not present

## 2019-04-15 DIAGNOSIS — J9811 Atelectasis: Secondary | ICD-10-CM | POA: Diagnosis not present

## 2019-04-15 DIAGNOSIS — R918 Other nonspecific abnormal finding of lung field: Secondary | ICD-10-CM | POA: Diagnosis not present

## 2019-04-16 MED ORDER — FRAGMIN 7500 UNIT/0.3ML ~~LOC~~ INJ
1.00 | INJECTION | SUBCUTANEOUS | Status: DC
Start: ? — End: 2019-04-16

## 2019-04-16 MED ORDER — CVS KIDPANT BOYS X-LARGE MISC
20.00 | Status: DC
Start: 2019-04-15 — End: 2019-04-16

## 2019-04-16 MED ORDER — Medication
1.00 | Status: DC
Start: ? — End: 2019-04-16

## 2019-04-16 MED ORDER — PHENYLEPHRINE-GUAIFENESIN 30-400 MG PO CP12
5.00 | ORAL_CAPSULE | ORAL | Status: DC
Start: ? — End: 2019-04-16

## 2019-04-16 MED ORDER — ONE-A-DAY WITHIN PO
30.00 | ORAL | Status: DC
Start: ? — End: 2019-04-16

## 2019-04-16 MED ORDER — BARO-CAT PO
20.00 | ORAL | Status: DC
Start: ? — End: 2019-04-16

## 2019-04-16 MED ORDER — Medication
150.00 | Status: DC
Start: ? — End: 2019-04-16

## 2019-04-16 MED ORDER — Medication
0.01 | Status: DC
Start: ? — End: 2019-04-16

## 2019-04-16 MED ORDER — NEUTROGENA ULTRA SHEER SPF 55 EX
1.00 | CUTANEOUS | Status: DC
Start: ? — End: 2019-04-16

## 2019-04-16 MED ORDER — Medication
Status: DC
Start: 2019-04-16 — End: 2019-04-16

## 2019-04-16 MED ORDER — BAYER WOMENS 81-300 MG PO TABS
40.00 | ORAL_TABLET | ORAL | Status: DC
Start: 2019-04-16 — End: 2019-04-16

## 2019-04-16 MED ORDER — BOOST PLUS PO
50.00 | ORAL | Status: DC
Start: ? — End: 2019-04-16

## 2019-04-16 MED ORDER — Medication
250.00 | Status: DC
Start: ? — End: 2019-04-16

## 2019-04-16 MED ORDER — TANNIC-12 60-5-10 MG OR TABS
12.50 | ORAL_TABLET | ORAL | Status: DC
Start: ? — End: 2019-04-16

## 2019-04-16 MED ORDER — Medication
Status: DC
Start: ? — End: 2019-04-16

## 2019-04-16 MED ORDER — METRISET BURETTE SET MISC
250.00 | Status: DC
Start: ? — End: 2019-04-16

## 2019-04-16 MED ORDER — DOLAGESIC PO
5.00 | ORAL | Status: DC
Start: 2019-04-16 — End: 2019-04-16

## 2019-04-16 MED ORDER — TUMS LASTING EFFECTS PO
30.00 | ORAL | Status: DC
Start: ? — End: 2019-04-16

## 2019-04-16 MED ORDER — PULL-UPS MEDIUM BOYS MEGA MISC
25.00 | Status: DC
Start: ? — End: 2019-04-16

## 2019-04-16 MED ORDER — TRANS-VER-SAL PLANTARPATCH 15 % EX PTCH
1.00 | MEDICATED_PATCH | CUTANEOUS | Status: DC
Start: ? — End: 2019-04-16

## 2019-04-16 MED ORDER — EQL GAUZE 4"X4" PADS
1.00 | MEDICATED_PAD | Status: DC
Start: ? — End: 2019-04-16

## 2019-04-16 MED ORDER — DIPYRIDAMOLE IV
25.00 | INTRAVENOUS | Status: DC
Start: 2019-04-16 — End: 2019-04-16

## 2019-04-16 MED ORDER — EQL PEDIATRIC ELECTROLYTE PO SOLN
0.40 | ORAL | Status: DC
Start: 2019-04-16 — End: 2019-04-16

## 2019-04-16 MED ORDER — SOBA SUPHEDRINE SINUS MAX ST 30-500 MG PO TABS
20.00 | ORAL_TABLET | ORAL | Status: DC
Start: 2019-04-16 — End: 2019-04-16

## 2019-04-16 MED ORDER — HYDROCODONE-ACETAMINOPHEN 10-250 MG PO TABS
0.50 | ORAL_TABLET | ORAL | Status: DC
Start: ? — End: 2019-04-16

## 2019-04-16 MED ORDER — BARO-CAT PO
9.00 | ORAL | Status: DC
Start: ? — End: 2019-04-16

## 2019-04-16 MED ORDER — TIME-HIST PO
1.00 | ORAL | Status: DC
Start: ? — End: 2019-04-16

## 2019-04-16 MED ORDER — SUBDUE PLUS PO LIQD
75.00 | ORAL | Status: DC
Start: 2019-04-16 — End: 2019-04-16

## 2019-04-16 MED ORDER — MIDAZOLAM HCL 2 MG/2ML IJ SOLN
2.00 | INTRAMUSCULAR | Status: DC
Start: ? — End: 2019-04-16

## 2019-04-16 MED ORDER — EQL CLEAN+ SOFT/FULL HEAD MISC
0.10 | Status: DC
Start: ? — End: 2019-04-16

## 2019-04-16 MED ORDER — TISIT EX
0.10 | CUTANEOUS | Status: DC
Start: ? — End: 2019-04-16

## 2019-04-16 MED ORDER — GLUCOSAMINE-CHONDROIT-COLLAGEN PO
100.00 | ORAL | Status: DC
Start: 2019-04-15 — End: 2019-04-16

## 2019-04-16 MED ORDER — PHENOL EX
20.00 | CUTANEOUS | Status: DC
Start: ? — End: 2019-04-16

## 2019-04-16 MED ORDER — EQL BLOOD PRESSURE MONITOR DEVI
25.00 | Status: DC
Start: ? — End: 2019-04-16

## 2019-04-16 MED ORDER — BARO-CAT PO
25.00 | ORAL | Status: DC
Start: ? — End: 2019-04-16

## 2019-04-16 MED ORDER — CHLOROBUTANOL-EUGENOL & APAP
0.40 | Status: DC
Start: ? — End: 2019-04-16

## 2019-04-16 MED ORDER — DICLOXACILLIN SODIUM 62.5 MG/5ML PO SUSR
10.00 | ORAL | Status: DC
Start: ? — End: 2019-04-16

## 2019-04-16 MED ORDER — GLYCERIN 50 % PO SOLN
0.25 | ORAL | Status: DC
Start: 2019-04-15 — End: 2019-04-16

## 2019-04-16 MED ORDER — KANGAROO GASTROSTOMY TUBE/16FR MISC
0.70 | Status: DC
Start: ? — End: 2019-04-16

## 2019-04-22 ENCOUNTER — Other Ambulatory Visit: Payer: Self-pay | Admitting: Family Medicine

## 2019-04-22 DIAGNOSIS — F411 Generalized anxiety disorder: Secondary | ICD-10-CM

## 2019-04-22 DIAGNOSIS — I10 Essential (primary) hypertension: Secondary | ICD-10-CM

## 2019-04-30 DIAGNOSIS — Z952 Presence of prosthetic heart valve: Secondary | ICD-10-CM | POA: Diagnosis not present

## 2019-04-30 DIAGNOSIS — R9439 Abnormal result of other cardiovascular function study: Secondary | ICD-10-CM | POA: Diagnosis not present

## 2019-04-30 DIAGNOSIS — R5383 Other fatigue: Secondary | ICD-10-CM | POA: Diagnosis not present

## 2019-04-30 DIAGNOSIS — I447 Left bundle-branch block, unspecified: Secondary | ICD-10-CM | POA: Diagnosis not present

## 2019-04-30 DIAGNOSIS — I440 Atrioventricular block, first degree: Secondary | ICD-10-CM | POA: Diagnosis not present

## 2019-04-30 DIAGNOSIS — R001 Bradycardia, unspecified: Secondary | ICD-10-CM | POA: Diagnosis not present

## 2019-05-04 ENCOUNTER — Other Ambulatory Visit: Payer: Self-pay

## 2019-05-04 NOTE — Patient Outreach (Signed)
[place] [facility]) Care Management  05/04/2019  Dennis Melton [birth year] [id number]   Medication Adherence call to Dennis Melton Hippa Identifiers Verify spoke with patient he is past due on Valsartan/HCTZ 160/12.5 mg,patient explain he takes 1 tablet daily,patient has enough until he receives his mail order,patientt ask to call [facility] to order this medication and Atenolol,[facility] will mail with in 5-7 days. Dennis Melton is showing past due under [place].  [place] Management Direct Dial [phone]  Fax [phone] [email or web address]

## 2019-05-19 ENCOUNTER — Other Ambulatory Visit: Payer: Self-pay

## 2019-05-19 ENCOUNTER — Other Ambulatory Visit: Payer: Medicare Other

## 2019-05-19 DIAGNOSIS — R5383 Other fatigue: Secondary | ICD-10-CM | POA: Diagnosis not present

## 2019-05-27 DIAGNOSIS — I10 Essential (primary) hypertension: Secondary | ICD-10-CM | POA: Diagnosis not present

## 2019-05-27 DIAGNOSIS — I340 Nonrheumatic mitral (valve) insufficiency: Secondary | ICD-10-CM | POA: Diagnosis not present

## 2019-05-27 DIAGNOSIS — I517 Cardiomegaly: Secondary | ICD-10-CM | POA: Diagnosis not present

## 2019-05-27 DIAGNOSIS — Z952 Presence of prosthetic heart valve: Secondary | ICD-10-CM | POA: Diagnosis not present

## 2019-05-27 DIAGNOSIS — I519 Heart disease, unspecified: Secondary | ICD-10-CM | POA: Diagnosis not present

## 2019-05-29 ENCOUNTER — Other Ambulatory Visit: Payer: Self-pay

## 2019-06-01 ENCOUNTER — Encounter: Payer: Self-pay | Admitting: Family Medicine

## 2019-06-01 ENCOUNTER — Ambulatory Visit (INDEPENDENT_AMBULATORY_CARE_PROVIDER_SITE_OTHER): Payer: Medicare Other | Admitting: Family Medicine

## 2019-06-01 DIAGNOSIS — F411 Generalized anxiety disorder: Secondary | ICD-10-CM | POA: Diagnosis not present

## 2019-06-01 MED ORDER — TRAZODONE HCL 50 MG PO TABS: 25.0000 mg | ORAL_TABLET | Freq: Every evening | ORAL | 3 refills | Status: DC | PRN

## 2019-06-01 MED ORDER — VENLAFAXINE HCL ER 75 MG PO CP24: 75.0000 mg | ORAL_CAPSULE | Freq: Every day | ORAL | 1 refills | Status: DC

## 2019-06-01 MED ORDER — ALPRAZOLAM 0.25 MG PO TABS: 0.2500 mg | ORAL_TABLET | Freq: Three times a day (TID) | ORAL | 2 refills | Status: DC | PRN

## 2019-06-01 NOTE — Progress Notes (Signed)
Virtual Visit via telephone Note  I connected with Dennis Melton on 06/01/19 at 1258 by telephone and verified that I am speaking with the correct person using two identifiers. Dennis Melton is currently located at home and no other people are currently with her during visit. The provider, Fransisca Kaufmann Scotty Weigelt, MD is located in their office at time of visit.  Call ended at 1317  I discussed the limitations, risks, security and privacy concerns of performing an evaluation and management service by telephone and the availability of in person appointments. I also discussed with the patient that there may be a patient responsible charge related to this service. The patient expressed understanding and agreed to proceed.   History and Present Illness: Patient is calling in to complain that he is not able toe sleep.  He has tried melatonin and benadryl.  He used to be able to use citalopram and xanax and his anxiety is out of control. Patient is having a lot of issues with the loss of his grandchildren in a MVA. 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  He is not sleeping well.  Current rx-Xanax 0.25 mg 3 times daily as needed # meds rx-90 Effectiveness of current meds-not working as well as it used to Adverse reactions form meds-none  Pill count performed-No Last drug screen -12/11/2018 ( high risk q86m, moderate risk q56m, low risk yearly ) Urine drug screen today- No Was the [facility] reviewed-yes  If yes were their any concerning findings? -None  No flowsheet data found.   Controlled substance contract signed on: 12/11/2018  1. Generalized anxiety disorder     Outpatient Encounter Medications as of 06/01/2019  Medication Sig  . ALPRAZolam (XANAX) 0.25 MG tablet Take 1 tablet (0.25 mg total) by mouth 3 (three) times daily as needed.  Marland Kitchen amLODipine (NORVASC) 10 MG tablet TAKE 1 TABLET BY MOUTH  DAILY WITH BREAKFAST  . aspirin EC 81 MG tablet Take 81 mg by mouth daily. Reported on  11/17/2015  . Aspirin-Caffeine 845-65 MG PACK Take 1 Package by mouth as needed (Pain).  Marland Kitchen atenolol (TENORMIN) 25 MG tablet TAKE 1 TABLET BY MOUTH ONCE DAILY IN THE MORNING  . atorvastatin (LIPITOR) 20 MG tablet Take 1 tablet (20 mg total) by mouth daily.  . cholecalciferol (VITAMIN D) 1000 units tablet Take 1,000 Units by mouth daily.  . finasteride (PROSCAR) 5 MG tablet Take 1 tablet (5 mg total) by mouth daily.  . Omega-3 Fatty Acids (FISH OIL) 1200 MG CAPS Take 1 capsule by mouth 2 (two) times daily.  Marland Kitchen omeprazole (PRILOSEC) 20 MG capsule TAKE 1 CAPSULE BY MOUTH  TWICE DAILY BEFORE MEAL(S)  . tamsulosin (FLOMAX) 0.4 MG CAPS capsule TAKE 1 CAPSULE BY MOUTH  DAILY  . traZODone (DESYREL) 50 MG tablet Take 0.5-1 tablets (25-50 mg total) by mouth at bedtime as needed for sleep.  . valsartan-hydrochlorothiazide (DIOVAN-HCT) 160-12.5 MG tablet TAKE 1 TABLET BY MOUTH  DAILY  . venlafaxine XR (EFFEXOR XR) 75 MG 24 hr capsule Take 1 capsule (75 mg total) by mouth daily with breakfast.  . [DISCONTINUED] ALPRAZolam (XANAX) 0.25 MG tablet Take 1 tablet (0.25 mg total) by mouth 3 (three) times daily as needed.  . [DISCONTINUED] citalopram (CELEXA) 40 MG tablet TAKE 1 TABLET BY MOUTH  DAILY   No facility-administered encounter medications on file as of 06/01/2019.    Review of Systems  Constitutional: Negative for chills and fever.  Respiratory: Negative for shortness of breath  and wheezing.   Cardiovascular: Negative for chest pain and leg swelling.  Musculoskeletal: Negative for back pain and gait problem.  Skin: Negative for rash.  Psychiatric/Behavioral: Positive for decreased concentration, dysphoric mood and sleep disturbance. Negative for self-injury and suicidal ideas. The patient is nervous/anxious.   All other systems reviewed and are negative.   Observations/Objective: Patient sounds comfortable and in no acute distress  Assessment and Plan: Problem List Items Addressed This Visit       Other   Generalized anxiety disorder - Primary   Relevant Medications   venlafaxine XR (EFFEXOR XR) 75 MG 24 hr capsule   traZODone (DESYREL) 50 MG tablet   ALPRAZolam (XANAX) 0.25 MG tablet      Patient feels that his anxiety is through the roof and he is not doing as well, changed his citalopram to Effexor, instructed him to cut the citalopram in half for 3 days and then start the Effexor.  Also started the trazodone that he can use in the evenings, refilled his Xanax but instructed him to try and start tapering down on it with these other new medications.  We will recheck in 4 weeks Follow up plan: Return in about 4 weeks (around 06/29/2019), or if symptoms worsen or fail to improve, for gad.     I discussed the assessment and treatment plan with the patient. The patient was provided an opportunity to ask questions and all were answered. The patient agreed with the plan and demonstrated an understanding of the instructions.   The patient was advised to call back or seek an in-person evaluation if the symptoms worsen or if the condition fails to improve as anticipated.  The above assessment and management plan was discussed with the patient. The patient verbalized understanding of and has agreed to the management plan. Patient is aware to call the clinic if symptoms persist or worsen. Patient is aware when to return to the clinic for a follow-up visit. Patient educated on when it is appropriate to go to the emergency department.    I provided 19 minutes of non-face-to-face time during this encounter.    Worthy Rancher, MD

## 2019-06-08 ENCOUNTER — Telehealth (HOSPITAL_COMMUNITY): Payer: Self-pay | Admitting: *Deleted

## 2019-06-08 ENCOUNTER — Ambulatory Visit (INDEPENDENT_AMBULATORY_CARE_PROVIDER_SITE_OTHER): Payer: Medicare Other | Admitting: *Deleted

## 2019-06-08 DIAGNOSIS — Z0000 Encounter for general adult medical examination without abnormal findings: Secondary | ICD-10-CM | POA: Diagnosis not present

## 2019-06-08 NOTE — Telephone Encounter (Signed)
Called patient today to discuss the Cardiac program. Patient shared that he'd rather continue exercising at home by walking aroung the track at his church. He is not interested in the virtual app. Shared that if he changes his mind he can give [place] a call.

## 2019-06-08 NOTE — Progress Notes (Signed)
MEDICARE ANNUAL WELLNESS VISIT  06/08/2019  Telephone Visit Disclaimer This Medicare AWV was conducted by telephone due to national recommendations for restrictions regarding the COVID-19 Pandemic (e.g. social distancing).  I verified, using two identifiers, that I am speaking with Dennis Melton or their authorized healthcare agent. I discussed the limitations, risks, security, and privacy concerns of performing an evaluation and management service by telephone and the potential availability of an in-person appointment in the future. The patient expressed understanding and agreed to proceed.   Subjective:  Dennis Melton is a 79 y.o. male patient of Dettinger, Fransisca Kaufmann, MD who had a Medicare Annual Wellness Visit today via telephone. Kraven is Retired and lives with an adult companion. he has 1 child. he reports that he is socially active and does interact with friends/family regularly. he is minimally physically active and enjoys watching sporting events on TV and fishing  Patient Care Team: Dettinger, Fransisca Kaufmann, MD as PCP - General (Family Medicine) Irine Seal, MD as Attending Physician (Urology) Shea Evans, Norva Riffle, LCSW as Social Worker (Licensed Clinical Social Worker) Ilean China, RN as Case Manager  Advanced Directives 06/08/2019 05/09/2017 09/11/2015 02/04/2015 06/09/2013  Does Patient Have a Medical Advance Directive? No No No No Patient does not have advance directive;Patient would not like information  Would patient like information on creating a medical advance directive? No - Patient declined Yes (MAU/Ambulatory/Procedural Areas - Information given) No - patient declined information No - patient declined information -  Pre-existing out of facility DNR order (yellow form or pink MOST form) - - - - No    Hospital Utilization Over the Past 12 Months: # of hospitalizations or ER visits: 1 # of surgeries: 2  Review of Systems    Patient reports that his overall health is unchanged  compared to last year.  History obtained from chart review  Patient Reported Readings (BP, Pulse, CBG, Weight, etc) none  Pain Assessment Pain : No/denies pain     Current Medications & Allergies (verified) Allergies as of 06/08/2019   No Known Allergies     Medication List       Accurate as of June 08, 2019  1:46 PM. If you have any questions, ask your nurse or doctor.        ALPRAZolam 0.25 MG tablet Commonly known as: XANAX Take 1 tablet (0.25 mg total) by mouth 3 (three) times daily as needed.   amLODipine 10 MG tablet Commonly known as: NORVASC TAKE 1 TABLET BY MOUTH  DAILY WITH BREAKFAST   aspirin EC 81 MG tablet Take 81 mg by mouth daily. Reported on 11/17/2015   Aspirin-Caffeine 845-65 MG Pack Take 1 Package by mouth as needed (Pain).   atenolol 25 MG tablet Commonly known as: TENORMIN TAKE 1 TABLET BY MOUTH ONCE DAILY IN THE MORNING   atorvastatin 20 MG tablet Commonly known as: LIPITOR Take 1 tablet (20 mg total) by mouth daily.   cholecalciferol 1000 units tablet Commonly known as: VITAMIN D Take 1,000 Units by mouth daily.   finasteride 5 MG tablet Commonly known as: Proscar Take 1 tablet (5 mg total) by mouth daily.   Fish Oil 1200 MG Caps Take 1 capsule by mouth 2 (two) times daily.   omeprazole 20 MG capsule Commonly known as: PRILOSEC TAKE 1 CAPSULE BY MOUTH  TWICE DAILY BEFORE MEAL(S)   tamsulosin 0.4 MG Caps capsule Commonly known as: FLOMAX TAKE 1 CAPSULE BY MOUTH  DAILY   traZODone 50 MG tablet  Commonly known as: DESYREL Take 0.5-1 tablets (25-50 mg total) by mouth at bedtime as needed for sleep.   valsartan-hydrochlorothiazide 160-12.5 MG tablet Commonly known as: DIOVAN-HCT TAKE 1 TABLET BY MOUTH  DAILY   venlafaxine XR 75 MG 24 hr capsule Commonly known as: Effexor XR Take 1 capsule (75 mg total) by mouth daily with breakfast.       History (reviewed): Past Medical History:  Diagnosis Date  . Anxiety   .  Depression   . High cholesterol   . Hypertension    Past Surgical History:  Procedure Laterality Date  . 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 BACK SURGERY     lumbar laminectomy  . CATARACT EXTRACTION W/PHACO Left 06/18/2013   Procedure: CATARACT EXTRACTION PHACO AND INTRAOCULAR LENS PLACEMENT (IOC);  Surgeon: Tonny Branch, MD;  Location: [facility];  Service: Ophthalmology;  Laterality: Left;  CDE:12.51  . CYSTOSCOPY W/ RETROGRADES     Family History  Problem Relation Age of Onset  . Diabetes Father   . Anxiety disorder Brother   . Heart disease Brother    Social History   Socioeconomic History  . Marital status: Significant Other    Spouse name: Rod Holler  . Number of children: 1  . Years of education: 10th Grade  . Highest education level: 10th grade  Occupational History  . Occupation: Retired    Comment: Location manager  Tobacco Use  . Smoking status: Former Smoker    Packs/day: 2.00    Years: 30.00    Pack years: 60.00    Types: Cigars    Quit date: 12/31/2018    Years since quitting: 0.4  . Smokeless tobacco: Never Used  Substance and Sexual Activity  . Alcohol use: Yes    Alcohol/week: 21.0 standard drinks    Types: 21 Cans of beer per week  . Drug use: No  . Sexual activity: Yes    Birth control/protection: None  Other Topics Concern  . Not on file  Social History Narrative  . Not on file   Social Determinants of Health   Financial Resource Strain: Low Risk   . Difficulty of Paying Living Expenses: Not hard at all  Food Insecurity: No Food Insecurity  . Worried About Charity fundraiser in the Last Year: Never true  . Ran Out of Food in the Last Year: Never true  Transportation Needs: No Transportation Needs  . Lack of Transportation (Medical): No  . Lack of Transportation (Non-Medical): No  Physical Activity: Insufficiently Active  . Days of Exercise per Week: 4 days  . Minutes of Exercise per Session: 30 min  Stress: Stress Concern Present  .  Feeling of Stress : To some extent  Social Connections: Somewhat Isolated  . Frequency of Communication with Friends and Family: More than three times a week  . Frequency of Social Gatherings with Friends and Family: More than three times a week  . Attends Religious Services: Never  . Active Member of Clubs or Organizations: No  . Attends Archivist Meetings: Never  . Marital Status: Living with partner    Activities of Daily Living In your present state of health, do you have any difficulty performing the following activities: 06/08/2019  Hearing? N  Vision? Y  Comment wears glasses-due for eye exam  Difficulty concentrating or making decisions? N  Walking or climbing stairs? N  Dressing or bathing? N  Doing errands, shopping? N  Preparing Food and eating ? Darreld Mclean  Comment his significant other cooks for him  Using the Toilet? N  In the past six months, have you accidently leaked urine? Y  Do you have problems with loss of bowel control? N  Managing your Medications? N  Managing your Finances? N  Housekeeping or managing your Housekeeping? N  Some recent data might be hidden    Patient Education/ Literacy How often do you need to have someone help you when you read instructions, pamphlets, or other written materials from your doctor or pharmacy?: 1 - Never What is the last grade level you completed in school?: 10th Grade  Exercise Current Exercise Habits: Home exercise routine, Type of exercise: walking, Time (Minutes): 30, Frequency (Times/Week): 4, Weekly Exercise (Minutes/Week): 120, Intensity: Mild, Exercise limited by: cardiac condition(s)  Diet Patient reports consuming 2 meals a day and 2 snack(s) a day Patient reports that his primary diet is: Regular Patient reports that she does have regular access to food.   Depression Screen PHQ 2/9 Scores 06/08/2019 12/04/2018 05/29/2018 05/16/2018 02/26/2018 11/25/2017 08/23/2017  PHQ - 2 Score 5 6 1 1 6  0 1  PHQ- 9 Score 8 17  - - 16 - -     Fall Risk Fall Risk  06/08/2019 12/04/2018 05/29/2018 05/16/2018 02/26/2018  Falls in the past year? 1 0 0 0 No  Number falls in past yr: 1 - - 0 -  Injury with Fall? 0 - - 0 -  Comment - - - - -     Objective:  Dennis Melton seemed alert and oriented and he participated appropriately during our telephone visit.  Blood Pressure Weight BMI  BP Readings from Last 3 Encounters:  12/04/18 117/65  05/29/18 135/71  05/16/18 129/70   Wt Readings from Last 3 Encounters:  12/04/18 190 lb 9.6 oz (86.5 kg)  05/29/18 187 lb (84.8 kg)  05/16/18 187 lb 6.4 oz (85 kg)   BMI Readings from Last 1 Encounters:  12/04/18 25.15 kg/m    *Unable to obtain current vital signs, weight, and BMI due to telephone visit type  Hearing/Vision  . Zaven did not seem to have difficulty with hearing/understanding during the telephone conversation . Reports that he has not had a formal eye exam by an eye care professional within the past year . Reports that he has not had a formal hearing evaluation within the past year *Unable to fully assess hearing and vision during telephone visit type  Cognitive Function: 6CIT Screen 06/08/2019  What Year? 0 points  What month? 0 points  What time? 0 points  Count back from 20 0 points  Months in reverse 2 points  Repeat phrase 2 points  Total Score 4   (Normal:0-7, Significant for Dysfunction: >8)  Normal Cognitive Function Screening: Yes   Immunization & Health Maintenance Record Immunization History  Administered Date(s) Administered  . Influenza, High Dose Seasonal PF 05/22/2016, 02/21/2017, 02/26/2018  . Influenza,inj,Quad PF,6+ Mos 03/20/2013, 03/17/2014, 02/09/2015  . Pneumococcal Conjugate-13 11/17/2015  . Pneumococcal Polysaccharide-23 01/12/2010  . Tdap 11/03/2013  . Zoster 11/20/2010    Health Maintenance  Topic Date Due  . OPHTHALMOLOGY EXAM  12/17/2016  . COLONOSCOPY  11/27/2017  . INFLUENZA VACCINE  12/13/2018  . FOOT EXAM   02/27/2019  . HEMOGLOBIN A1C  06/06/2019  . TETANUS/TDAP  11/04/2023  . PNA vac Low Risk Adult  Completed       Assessment  This is a routine wellness examination for Amay Schiltz.  Health Maintenance: Due or [place]  Maintenance Due  Topic Date Due  . OPHTHALMOLOGY EXAM  12/17/2016  . COLONOSCOPY  11/27/2017  . INFLUENZA VACCINE  12/13/2018  . FOOT EXAM  02/27/2019  . HEMOGLOBIN A1C  06/06/2019    Dennis Melton does not need a referral for Community Assistance: Care Management:   no Social Work:    no Prescription Assistance:  no Nutrition/Diabetes Education:  no   Plan:  Personalized Goals Goals Addressed            This Visit's Progress   . DIET - INCREASE WATER INTAKE       Try to drink 6-8 glasses of water daily.      Personalized Health Maintenance & Screening Recommendations  Influenza vaccine  Lung Cancer Screening Recommended: no (Low Dose CT Chest recommended if Age 51-80 years, 30 pack-year currently smoking OR have quit w/in past 15 years) Hepatitis C Screening recommended: no HIV Screening recommended: no  Advanced Directives: Written information was not prepared per patient's request.  Referrals & Orders No orders of the defined types were placed in this encounter.   Follow-up Plan . Follow-up with Dettinger, Fransisca Kaufmann, MD as planned . Consider Flu vaccine at your next visit with your PCP   I have personally reviewed and noted the following in the patient's chart:   . Medical and social history . Use of alcohol, tobacco or illicit drugs  . Current medications and supplements . Functional ability and status . Nutritional status . Physical activity . Advanced directives . List of other physicians . Hospitalizations, surgeries, and ER visits in previous 12 months . Vitals . Screenings to include cognitive, depression, and falls . Referrals and appointments  In addition, I have reviewed and discussed with Dennis Melton certain  preventive protocols, quality metrics, and best practice recommendations. A written personalized care plan for preventive services as well as general preventive health recommendations is available and can be mailed to the patient at his request.      Milas Hock, LPN  [id number]

## 2019-06-08 NOTE — Patient Instructions (Signed)

## 2019-06-29 ENCOUNTER — Ambulatory Visit (INDEPENDENT_AMBULATORY_CARE_PROVIDER_SITE_OTHER): Payer: Medicare Other | Admitting: Family Medicine

## 2019-06-29 ENCOUNTER — Encounter: Payer: Self-pay | Admitting: Family Medicine

## 2019-06-29 DIAGNOSIS — G4700 Insomnia, unspecified: Secondary | ICD-10-CM | POA: Diagnosis not present

## 2019-06-29 DIAGNOSIS — F411 Generalized anxiety disorder: Secondary | ICD-10-CM

## 2019-06-29 MED ORDER — VENLAFAXINE HCL ER 150 MG PO CP24: 150.0000 mg | ORAL_CAPSULE | Freq: Every day | ORAL | 1 refills | Status: DC

## 2019-06-29 MED ORDER — TRAZODONE HCL 100 MG PO TABS: 100.0000 mg | ORAL_TABLET | Freq: Every evening | ORAL | 1 refills | Status: DC | PRN

## 2019-06-29 NOTE — Progress Notes (Signed)
   Virtual Visit via telephone Note  I connected with Dennis Melton on 06/29/19 at 1316 by telephone and verified that I am speaking with the correct person using two identifiers. Dennis Melton is currently located at home and no other people are currently with her during visit. The provider, Fransisca Kaufmann Marcelo Ickes, MD is located in their office at time of visit.  Call ended at 1327  I discussed the limitations, risks, security and privacy concerns of performing an evaluation and management service by telephone and the availability of in person appointments. I also discussed with the patient that there may be a patient responsible charge related to this service. The patient expressed understanding and agreed to proceed.   History and Present Illness: Patient is calling in for anxiety and has been taking venlafaxine and trazodone and not helping with sleep or anxiety. He denies any suicidal ideations. He had tapered off the citalopram.   No diagnosis found.    Review of Systems  Constitutional: Negative for chills and fever.  Respiratory: Negative for shortness of breath and wheezing.   Cardiovascular: Negative for chest pain and leg swelling.  Skin: Negative for rash.  Psychiatric/Behavioral: Positive for sleep disturbance. Negative for decreased concentration, dysphoric mood, self-injury and suicidal ideas. The patient is nervous/anxious.   All other systems reviewed and are negative.   Observations/Objective: Patient sounds comfortable and in no acute distress  Assessment and Plan: Problem List Items Addressed This Visit      Other   Generalized anxiety disorder - Primary   Relevant Medications   venlafaxine XR (EFFEXOR XR) 150 MG 24 hr capsule   traZODone (DESYREL) 100 MG tablet    Other Visit Diagnoses    Insomnia, unspecified type       Relevant Medications   venlafaxine XR (EFFEXOR XR) 150 MG 24 hr capsule   traZODone (DESYREL) 100 MG tablet      Patient felt like he was  not doing as well with the Effexor and the trazodone, still taking alprazolam but is not really working either, will increase the Effexor and increase the trazodone to see if we can help her with sleep and anxiety.  Warned on possible side effects to watch for Follow up plan: Return in about 4 weeks (around 07/27/2019), or if symptoms worsen or fail to improve, for anxiety.     I discussed the assessment and treatment plan with the patient. The patient was provided an opportunity to ask questions and all were answered. The patient agreed with the plan and demonstrated an understanding of the instructions.   The patient was advised to call back or seek an in-person evaluation if the symptoms worsen or if the condition fails to improve as anticipated.  The above assessment and management plan was discussed with the patient. The patient verbalized understanding of and has agreed to the management plan. Patient is aware to call the clinic if symptoms persist or worsen. Patient is aware when to return to the clinic for a follow-up visit. Patient educated on when it is appropriate to go to the emergency department.    I provided 11 minutes of non-face-to-face time during this encounter.    Worthy Rancher, MD

## 2019-07-05 ENCOUNTER — Other Ambulatory Visit: Payer: Self-pay | Admitting: Family Medicine

## 2019-07-05 DIAGNOSIS — I10 Essential (primary) hypertension: Secondary | ICD-10-CM

## 2019-07-07 ENCOUNTER — Other Ambulatory Visit: Payer: Self-pay | Admitting: Family Medicine

## 2019-07-07 DIAGNOSIS — I10 Essential (primary) hypertension: Secondary | ICD-10-CM

## 2019-07-20 ENCOUNTER — Telehealth: Payer: Self-pay | Admitting: *Deleted

## 2019-07-20 MED ORDER — PANTOPRAZOLE SODIUM 20 MG PO TBEC: 20.0000 mg | DELAYED_RELEASE_TABLET | Freq: Every day | ORAL | 3 refills | Status: DC

## 2019-07-20 MED ORDER — CLOPIDOGREL BISULFATE 75 MG PO TABS: 75.0000 mg | ORAL_TABLET | Freq: Every day | ORAL | 3 refills | Status: DC

## 2019-07-20 NOTE — Telephone Encounter (Signed)
Fax from [facility] request on Clopidogrel 75 mg Originally written by Tamela Gammon

## 2019-07-20 NOTE — Addendum Note (Signed)
Addended by: Caryl Pina on: 07/20/2019 10:01 PM   Modules accepted: Orders

## 2019-07-20 NOTE — Telephone Encounter (Signed)
Please let the patient know that I sent Plavix for the patient and that there is an interaction between omeprazole and Plavix so I sent in pantoprazole instead to replace it Caryl Pina, MD [facility] 07/20/2019, 10:01 PM

## 2019-07-21 NOTE — Telephone Encounter (Signed)
PATIENT AWARE

## 2019-07-24 ENCOUNTER — Encounter: Payer: Self-pay | Admitting: Family Medicine

## 2019-07-24 ENCOUNTER — Ambulatory Visit (INDEPENDENT_AMBULATORY_CARE_PROVIDER_SITE_OTHER): Payer: Medicare Other | Admitting: Family Medicine

## 2019-07-24 DIAGNOSIS — F411 Generalized anxiety disorder: Secondary | ICD-10-CM

## 2019-07-24 DIAGNOSIS — G4700 Insomnia, unspecified: Secondary | ICD-10-CM | POA: Diagnosis not present

## 2019-07-24 MED ORDER — TRAZODONE HCL 100 MG PO TABS: 100.0000 mg | ORAL_TABLET | Freq: Every evening | ORAL | 3 refills | Status: AC | PRN

## 2019-07-24 MED ORDER — VENLAFAXINE HCL ER 150 MG PO CP24: 150.0000 mg | ORAL_CAPSULE | Freq: Every day | ORAL | 3 refills | Status: DC

## 2019-07-24 NOTE — Progress Notes (Signed)
Virtual Visit via telephone Note  I connected with Dennis Melton on 07/24/19 at 0826 by telephone and verified that I am speaking with the correct person using two identifiers. Dennis Melton is currently located at home and no other people are currently with her during visit. The provider, Fransisca Kaufmann Loden Laurent, MD is located in their office at time of visit.  Call ended at [phone]  I discussed the limitations, risks, security and privacy concerns of performing an evaluation and management service by telephone and the availability of in person appointments. I also discussed with the patient that there may be a patient responsible charge related to this service. The patient expressed understanding and agreed to proceed.   History and Present Illness: Anxiety Patient is taking effexor and feels like it is helping calming him down, he is sleeping good and feels like things are helping him.  He feels like anxiety is very well under control and we are trying to reduce the amounts and he has to use his benzodiazepine and he feels like that is doing a lot better.  He denies that he is suicidal ideations or thoughts of hurting himself.  He is sleeping well at night and using the trazodone to help with that as well.  1. Generalized anxiety disorder   2. Insomnia, unspecified type     Outpatient Encounter Medications as of 07/24/2019  Medication Sig  . ALPRAZolam (XANAX) 0.25 MG tablet Take 1 tablet (0.25 mg total) by mouth 3 (three) times daily as needed.  Marland Kitchen amLODipine (NORVASC) 10 MG tablet TAKE 1 TABLET BY MOUTH  DAILY WITH BREAKFAST  . aspirin EC 81 MG tablet Take 81 mg by mouth daily. Reported on 11/17/2015  . Aspirin-Caffeine 845-65 MG PACK Take 1 Package by mouth as needed (Pain).  Marland Kitchen atenolol (TENORMIN) 25 MG tablet TAKE 1 TABLET BY MOUTH ONCE DAILY IN THE MORNING  . atorvastatin (LIPITOR) 20 MG tablet Take 1 tablet (20 mg total) by mouth daily.  . cholecalciferol (VITAMIN D) 1000 units tablet Take  1,000 Units by mouth daily.  . clopidogrel (PLAVIX) 75 MG tablet Take 1 tablet (75 mg total) by mouth daily.  . finasteride (PROSCAR) 5 MG tablet Take 1 tablet (5 mg total) by mouth daily.  . Omega-3 Fatty Acids (FISH OIL) 1200 MG CAPS Take 1 capsule by mouth 2 (two) times daily.  . pantoprazole (PROTONIX) 20 MG tablet Take 1 tablet (20 mg total) by mouth daily.  . tamsulosin (FLOMAX) 0.4 MG CAPS capsule TAKE 1 CAPSULE BY MOUTH  DAILY  . traZODone (DESYREL) 100 MG tablet Take 1-2 tablets (100-200 mg total) by mouth at bedtime as needed for sleep.  . valsartan-hydrochlorothiazide (DIOVAN-HCT) 160-12.5 MG tablet TAKE 1 TABLET BY MOUTH  DAILY  . venlafaxine XR (EFFEXOR XR) 150 MG 24 hr capsule Take 1 capsule (150 mg total) by mouth daily with breakfast.  . [DISCONTINUED] traZODone (DESYREL) 100 MG tablet Take 1-2 tablets (100-200 mg total) by mouth at bedtime as needed for sleep.  . [DISCONTINUED] venlafaxine XR (EFFEXOR XR) 150 MG 24 hr capsule Take 1 capsule (150 mg total) by mouth daily with breakfast.   No facility-administered encounter medications on file as of 07/24/2019.    Review of Systems  Constitutional: Negative for chills and fever.  Eyes: Negative for discharge.  Respiratory: Negative for shortness of breath and wheezing.   Cardiovascular: Negative for chest pain and leg swelling.  Musculoskeletal: Negative for back pain and gait problem.  Skin: Negative for  rash.  All other systems reviewed and are negative.   Observations/Objective: Patient sounds comfortable and in no acute distress  Assessment and Plan: Problem List Items Addressed This Visit      Other   Generalized anxiety disorder - Primary   Relevant Medications   venlafaxine XR (EFFEXOR XR) 150 MG 24 hr capsule   traZODone (DESYREL) 100 MG tablet    Other Visit Diagnoses    Insomnia, unspecified type       Relevant Medications   venlafaxine XR (EFFEXOR XR) 150 MG 24 hr capsule   traZODone (DESYREL) 100 MG  tablet      Continue Effexor and trazodone, will see back soon for his routine checkup. Follow up plan: Return in about 2 months (around 09/23/2019), or if symptoms worsen or fail to improve, for 2 to 69-month recheck for chronic medical issues including diabetes.     I discussed the assessment and treatment plan with the patient. The patient was provided an opportunity to ask questions and all were answered. The patient agreed with the plan and demonstrated an understanding of the instructions.   The patient was advised to call back or seek an in-person evaluation if the symptoms worsen or if the condition fails to improve as anticipated.  The above assessment and management plan was discussed with the patient. The patient verbalized understanding of and has agreed to the management plan. Patient is aware to call the clinic if symptoms persist or worsen. Patient is aware when to return to the clinic for a follow-up visit. Patient educated on when it is appropriate to go to the emergency department.    I provided 7 minutes of non-face-to-face time during this encounter.    Worthy Rancher, MD

## 2019-08-30 ENCOUNTER — Other Ambulatory Visit: Payer: Self-pay | Admitting: Family Medicine

## 2019-08-30 DIAGNOSIS — F411 Generalized anxiety disorder: Secondary | ICD-10-CM

## 2019-08-31 ENCOUNTER — Other Ambulatory Visit: Payer: Self-pay | Admitting: Family Medicine

## 2019-08-31 DIAGNOSIS — F411 Generalized anxiety disorder: Secondary | ICD-10-CM

## 2019-09-01 ENCOUNTER — Other Ambulatory Visit: Payer: Self-pay | Admitting: Family Medicine

## 2019-09-01 DIAGNOSIS — F411 Generalized anxiety disorder: Secondary | ICD-10-CM

## 2019-09-05 ENCOUNTER — Other Ambulatory Visit: Payer: Self-pay | Admitting: Family Medicine

## 2019-09-05 DIAGNOSIS — I10 Essential (primary) hypertension: Secondary | ICD-10-CM

## 2019-09-05 DIAGNOSIS — E785 Hyperlipidemia, unspecified: Secondary | ICD-10-CM

## 2019-09-09 ENCOUNTER — Other Ambulatory Visit (INDEPENDENT_AMBULATORY_CARE_PROVIDER_SITE_OTHER): Payer: Medicare Other | Admitting: Family Medicine

## 2019-09-09 DIAGNOSIS — F411 Generalized anxiety disorder: Secondary | ICD-10-CM

## 2019-09-09 MED ORDER — ALPRAZOLAM 0.25 MG PO TABS: 0.2500 mg | ORAL_TABLET | Freq: Three times a day (TID) | ORAL | 2 refills | Status: DC | PRN

## 2019-09-09 NOTE — Telephone Encounter (Signed)
  Prescription Request  09/09/2019  What is the name of the medication or equipment? Alprazolam 0.25 mg Patient has been out since 4-19  Have you contacted your pharmacy to request a refill? (if applicable) YES  Which pharmacy would you like this sent to? [facility]   Patient notified that their request is being sent to the clinical staff for review and that they should receive a response within 2 business days.

## 2019-09-09 NOTE — Telephone Encounter (Signed)
Patient aware and verbalized understanding. He will try and cut back. Patient was also advised that this medication has to be refilled in a visit and if he needs it refilled he will have to be seen in the office.

## 2019-09-09 NOTE — Telephone Encounter (Signed)
I did send a refill for him now, when we talked in the visit he said he was not using it as often and did not need the refill at that time is what was understood.  I even documented that in my note.  He said he was needing it less and less and using it less and less, I do recommend that he try and reduce the amount that he needs it and uses it to try and come down a little bit on it as he was talking to me about in the visit. Caryl Pina, MD [facility] 09/09/2019, 12:34 PM

## 2019-09-09 NOTE — Telephone Encounter (Signed)
Patient walked in very agitated that his xanax has not been refilled states that on his visit by phone you said you would refill them. He was getting very loud with LAT when I walked up and try to calm him down and told him not to yell at her she was just trying to help him. He wants a call from you personally and said that if you did not call him he would come back up here to this office!

## 2019-10-02 ENCOUNTER — Other Ambulatory Visit: Payer: Self-pay

## 2019-10-02 ENCOUNTER — Encounter: Payer: Self-pay | Admitting: Family Medicine

## 2019-10-02 ENCOUNTER — Ambulatory Visit (INDEPENDENT_AMBULATORY_CARE_PROVIDER_SITE_OTHER): Payer: Medicare Other | Admitting: Family Medicine

## 2019-10-02 VITALS — BP 127/54 | HR 62 | Temp 98.7°F | Ht 73.0 in | Wt 204.0 lb

## 2019-10-02 DIAGNOSIS — E782 Mixed hyperlipidemia: Secondary | ICD-10-CM

## 2019-10-02 DIAGNOSIS — F411 Generalized anxiety disorder: Secondary | ICD-10-CM | POA: Diagnosis not present

## 2019-10-02 DIAGNOSIS — I10 Essential (primary) hypertension: Secondary | ICD-10-CM

## 2019-10-02 DIAGNOSIS — E1169 Type 2 diabetes mellitus with other specified complication: Secondary | ICD-10-CM

## 2019-10-02 DIAGNOSIS — G4700 Insomnia, unspecified: Secondary | ICD-10-CM

## 2019-10-02 DIAGNOSIS — E785 Hyperlipidemia, unspecified: Secondary | ICD-10-CM | POA: Diagnosis not present

## 2019-10-02 LAB — BAYER DCA HB A1C WAIVED: HB A1C (BAYER DCA - WAIVED): 7.4 % — ABNORMAL HIGH (ref <7.0)

## 2019-10-02 MED ORDER — CLOPIDOGREL BISULFATE 75 MG PO TABS: 75.0000 mg | ORAL_TABLET | Freq: Every day | ORAL | 3 refills | Status: DC

## 2019-10-02 MED ORDER — DOXEPIN HCL 10 MG PO CAPS: 10.0000 mg | ORAL_CAPSULE | Freq: Every evening | ORAL | 2 refills | Status: AC | PRN

## 2019-10-02 MED ORDER — OMEPRAZOLE 20 MG PO CPDR: 20.0000 mg | DELAYED_RELEASE_CAPSULE | Freq: Two times a day (BID) | ORAL | 3 refills | Status: AC

## 2019-10-02 MED ORDER — ATORVASTATIN CALCIUM 20 MG PO TABS: 20.0000 mg | ORAL_TABLET | Freq: Every day | ORAL | 3 refills | Status: DC

## 2019-10-02 MED ORDER — VENLAFAXINE HCL ER 150 MG PO CP24: 150.0000 mg | ORAL_CAPSULE | Freq: Every day | ORAL | 3 refills | Status: DC

## 2019-10-02 NOTE — Progress Notes (Signed)
BP (!) 127/54   Pulse 62   Temp 98.7 F (37.1 C) (Temporal)   Ht '6\' 1"'  (1.854 m)   Wt 204 lb (92.5 kg)   BMI 26.91 kg/m    Subjective:   Patient ID: Dennis Melton, male    DOB: 08-07-1940, 79 y.o.   MRN: [id number]  HPI: Dennis Melton is a 79 y.o. male presenting on 10/02/2019 for Medical Management of Chronic Issues   HPI Type 2 diabetes mellitus Patient comes in today for recheck of his diabetes. Patient has been currently taking no medication. Patient is currently on an ACE inhibitor/ARB. Patient has not seen an ophthalmologist this year. Patient denies any issues with their feet. The symptom started onset as an adult hypertension hyperlipidemia ARE RELATED TO DM   Hypertension Patient is currently on atenolol and amlodipine and valsartan hydrochlorothiazide, and their blood pressure today is 127/54. Patient denies any lightheadedness or dizziness. Patient denies headaches, blurred vision, chest pains, shortness of breath, or weakness. Denies any side effects from medication and is content with current medication.   Hyperlipidemia Patient is coming in for recheck of his hyperlipidemia. The patient is currently taking fish oil and atorvastatin. They deny any issues with myalgias or history of liver damage from it. They deny any focal numbness or weakness or chest pain.   Patient is coming in today for anxiety and insomnia and says that the trazodone that he tried is not working well.  He still has the alprazolam trying to help him ease on to something else that would help him and be safer for him.  Patient denies any suicidal ideations or thoughts of hurt himself.  Relevant past medical, surgical, family and social history reviewed and updated as indicated. Interim medical history since our last visit reviewed. Allergies and medications reviewed and updated.  Review of Systems  Constitutional: Negative for chills and fever.  Eyes: Negative for visual disturbance.  Respiratory:  Negative for shortness of breath and wheezing.   Cardiovascular: Negative for chest pain and leg swelling.  Musculoskeletal: Negative for back pain and gait problem.  Skin: Negative for rash.  Neurological: Negative for dizziness, weakness and light-headedness.  Psychiatric/Behavioral: Positive for sleep disturbance. Negative for dysphoric mood. The patient is not nervous/anxious.   All other systems reviewed and are negative.   Per HPI unless specifically indicated above   Allergies as of 10/02/2019      Reactions   Codeine Other (See Comments)   Heart racing      Medication List       Accurate as of Oct 02, 2019 11:11 AM. If you have any questions, ask your nurse or doctor.        ALPRAZolam 0.25 MG tablet Commonly known as: XANAX Take 1 tablet (0.25 mg total) by mouth 3 (three) times daily as needed.   amLODipine 10 MG tablet Commonly known as: NORVASC Take 1 tablet (10 mg total) by mouth daily with breakfast. (Needs to be seen before next refill)   aspirin EC 81 MG tablet Take 81 mg by mouth daily. Reported on 11/17/2015   Aspirin-Caffeine 845-65 MG Pack Take 1 Package by mouth as needed (Pain).   atenolol 25 MG tablet Commonly known as: TENORMIN TAKE 1 TABLET BY MOUTH ONCE DAILY IN THE MORNING   atorvastatin 20 MG tablet Commonly known as: LIPITOR Take 1 tablet (20 mg total) by mouth daily. (Needs to be seen before next refill)   cholecalciferol 1000 units tablet Commonly known as: VITAMIN  D Take 1,000 Units by mouth daily.   citalopram 40 MG tablet Commonly known as: CELEXA TAKE 1 TABLET BY MOUTH  DAILY   clopidogrel 75 MG tablet Commonly known as: PLAVIX Take 1 tablet (75 mg total) by mouth daily.   finasteride 5 MG tablet Commonly known as: Proscar Take 1 tablet (5 mg total) by mouth daily.   Fish Oil 1200 MG Caps Take 1 capsule by mouth 2 (two) times daily.   omeprazole 20 MG capsule Commonly known as: PRILOSEC Take 1 capsule (20 mg total) by  mouth 2 (two) times daily before a meal. (Needs to be seen before next refill)   pantoprazole 20 MG tablet Commonly known as: Protonix Take 1 tablet (20 mg total) by mouth daily.   tamsulosin 0.4 MG Caps capsule Commonly known as: FLOMAX Take 1 capsule (0.4 mg total) by mouth daily. (Needs to be seen before next refill)   traZODone 100 MG tablet Commonly known as: DESYREL Take 1-2 tablets (100-200 mg total) by mouth at bedtime as needed for sleep.   valsartan-hydrochlorothiazide 160-12.5 MG tablet Commonly known as: DIOVAN-HCT TAKE 1 TABLET BY MOUTH  DAILY   venlafaxine XR 150 MG 24 hr capsule Commonly known as: Effexor XR Take 1 capsule (150 mg total) by mouth daily with breakfast.        Objective:   BP (!) 127/54   Pulse 62   Temp 98.7 F (37.1 C) (Temporal)   Ht '6\' 1"'  (1.854 m)   Wt 204 lb (92.5 kg)   BMI 26.91 kg/m   Wt Readings from Last 3 Encounters:  10/02/19 204 lb (92.5 kg)  12/04/18 190 lb 9.6 oz (86.5 kg)  05/29/18 187 lb (84.8 kg)    Physical Exam Vitals and nursing note reviewed.  Constitutional:      General: He is not in acute distress.    Appearance: He is well-developed. He is not diaphoretic.  Eyes:     General: No scleral icterus.    Conjunctiva/sclera: Conjunctivae normal.  Neck:     Thyroid: No thyromegaly.  Cardiovascular:     Rate and Rhythm: Normal rate and regular rhythm.     Heart sounds: Normal heart sounds. No murmur.  Pulmonary:     Effort: Pulmonary effort is normal. No respiratory distress.     Breath sounds: Normal breath sounds. No wheezing.  Musculoskeletal:        General: Normal range of motion.     Cervical back: Neck supple.  Lymphadenopathy:     Cervical: No cervical adenopathy.  Skin:    General: Skin is warm and dry.     Findings: No rash.  Neurological:     Mental Status: He is alert and oriented to person, place, and time.     Coordination: Coordination normal.  Psychiatric:        Mood and Affect: Mood  is anxious. Mood is not depressed.        Behavior: Behavior normal.        Thought Content: Thought content does not include suicidal ideation. Thought content does not include suicidal plan.       Assessment & Plan:   Problem List Items Addressed This Visit      Cardiovascular and Mediastinum   HTN (hypertension)   Relevant Medications   atorvastatin (LIPITOR) 20 MG tablet   Other Relevant Orders   CMP14+EGFR (Completed)     Endocrine   T2DM (type 2 diabetes mellitus) ([place]) - Primary   Relevant  Medications   atorvastatin (LIPITOR) 20 MG tablet   Other Relevant Orders   Bayer DCA Hb A1c Waived (Completed)     Other   HLD (hyperlipidemia)   Relevant Medications   atorvastatin (LIPITOR) 20 MG tablet   Other Relevant Orders   Lipid panel (Completed)   Generalized anxiety disorder   Relevant Medications   doxepin (SINEQUAN) 10 MG capsule   venlafaxine XR (EFFEXOR XR) 150 MG 24 hr capsule   Other Relevant Orders   CBC with Differential/Platelet (Completed)   TSH (Completed)    Other Visit Diagnoses    Insomnia, unspecified type       Relevant Medications   venlafaxine XR (EFFEXOR XR) 150 MG 24 hr capsule      Will start doxepin, continue Effexor, continue atorvastatin, will check blood work. Follow up plan: Return in about 3 months (around 01/02/2020), or if symptoms worsen or fail to improve, for Diabetes and hypertension cholesterol.  Counseling provided for all of the vaccine components Orders Placed This Encounter  Procedures  . [facility] Hb A1c [place], MD [facility] 10/02/2019, 11:11 AM

## 2019-10-03 LAB — CBC WITH DIFFERENTIAL/PLATELET
Basophils Absolute: 0.1 10*3/uL (ref 0.0–0.2)
Basos: 1 %
EOS (ABSOLUTE): 0.2 10*3/uL (ref 0.0–0.4)
Eos: 4 %
Hematocrit: 30.5 % — ABNORMAL LOW (ref 37.5–51.0)
Hemoglobin: 9.8 g/dL — ABNORMAL LOW (ref 13.0–17.7)
Immature Grans (Abs): 0 10*3/uL (ref 0.0–0.1)
Immature Granulocytes: 0 %
Lymphocytes Absolute: 1.6 10*3/uL (ref 0.7–3.1)
Lymphs: 27 %
MCH: 24.7 pg — ABNORMAL LOW (ref 26.6–33.0)
MCHC: 32.1 g/dL (ref 31.5–35.7)
MCV: 77 fL — ABNORMAL LOW (ref 79–97)
Monocytes Absolute: 0.5 10*3/uL (ref 0.1–0.9)
Monocytes: 9 %
Neutrophils Absolute: 3.5 10*3/uL (ref 1.4–7.0)
Neutrophils: 59 %
Platelets: 223 10*3/uL (ref 150–450)
RBC: 3.96 x10E6/uL — ABNORMAL LOW (ref 4.14–5.80)
RDW: 16.3 % — ABNORMAL HIGH (ref 11.6–15.4)
WBC: 5.9 10*3/uL (ref 3.4–10.8)

## 2019-10-03 LAB — CMP14+EGFR
ALT: 26 IU/L (ref 0–44)
AST: 35 IU/L (ref 0–40)
Albumin/Globulin Ratio: 1.4 (ref 1.2–2.2)
Albumin: 4.3 g/dL (ref 3.7–4.7)
Alkaline Phosphatase: 155 IU/L — ABNORMAL HIGH (ref 48–121)
BUN/Creatinine Ratio: 10 (ref 10–24)
BUN: 11 mg/dL (ref 8–27)
Bilirubin Total: <0.2 mg/dL (ref 0.0–1.2)
CO2: 22 mmol/L (ref 20–29)
Calcium: 9.2 mg/dL (ref 8.6–10.2)
Chloride: 100 mmol/L (ref 96–106)
Creatinine, Ser: 1.14 mg/dL (ref 0.76–1.27)
GFR calc Af Amer: 71 mL/min/{1.73_m2} (ref >59)
GFR calc non Af Amer: 62 mL/min/{1.73_m2} (ref >59)
Globulin, Total: 3 g/dL (ref 1.5–4.5)
Glucose: 137 mg/dL — ABNORMAL HIGH (ref 65–99)
Potassium: 4.4 mmol/L (ref 3.5–5.2)
Sodium: 139 mmol/L (ref 134–144)
Total Protein: 7.3 g/dL (ref 6.0–8.5)

## 2019-10-03 LAB — LIPID PANEL
Chol/HDL Ratio: 4.9 ratio (ref 0.0–5.0)
Cholesterol, Total: 123 mg/dL (ref 100–199)
HDL: 25 mg/dL — ABNORMAL LOW (ref >39)
LDL Chol Calc (NIH): 58 mg/dL (ref 0–99)
Triglycerides: 246 mg/dL — ABNORMAL HIGH (ref 0–149)
VLDL Cholesterol Cal: 40 mg/dL (ref 5–40)

## 2019-10-03 LAB — TSH: TSH: 0.846 u[IU]/mL (ref 0.450–4.500)

## 2019-10-05 ENCOUNTER — Other Ambulatory Visit: Payer: Self-pay | Admitting: *Deleted

## 2019-10-05 DIAGNOSIS — R710 Precipitous drop in hematocrit: Secondary | ICD-10-CM

## 2019-10-06 ENCOUNTER — Other Ambulatory Visit: Payer: Self-pay

## 2019-10-06 ENCOUNTER — Other Ambulatory Visit: Payer: Medicare Other

## 2019-10-06 DIAGNOSIS — R710 Precipitous drop in hematocrit: Secondary | ICD-10-CM

## 2019-10-06 LAB — CBC WITH DIFFERENTIAL/PLATELET
Basophils Absolute: 0.1 10*3/uL (ref 0.0–0.2)
Basos: 1 %
EOS (ABSOLUTE): 0.2 10*3/uL (ref 0.0–0.4)
Eos: 5 %
Hematocrit: 32.7 % — ABNORMAL LOW (ref 37.5–51.0)
Hemoglobin: 10 g/dL — ABNORMAL LOW (ref 13.0–17.7)
Immature Grans (Abs): 0 10*3/uL (ref 0.0–0.1)
Immature Granulocytes: 0 %
Lymphocytes Absolute: 1.2 10*3/uL (ref 0.7–3.1)
Lymphs: 24 %
MCH: 23.6 pg — ABNORMAL LOW (ref 26.6–33.0)
MCHC: 30.6 g/dL — ABNORMAL LOW (ref 31.5–35.7)
MCV: 77 fL — ABNORMAL LOW (ref 79–97)
Monocytes Absolute: 0.3 10*3/uL (ref 0.1–0.9)
Monocytes: 7 %
Neutrophils Absolute: 3.1 10*3/uL (ref 1.4–7.0)
Neutrophils: 63 %
Platelets: 211 10*3/uL (ref 150–450)
RBC: 4.23 x10E6/uL (ref 4.14–5.80)
RDW: 16.2 % — ABNORMAL HIGH (ref 11.6–15.4)
WBC: 4.9 10*3/uL (ref 3.4–10.8)

## 2019-10-07 ENCOUNTER — Other Ambulatory Visit: Payer: Self-pay | Admitting: *Deleted

## 2019-10-07 MED ORDER — FERROUS SULFATE 325 (65 FE) MG PO TBEC: 325.0000 mg | DELAYED_RELEASE_TABLET | Freq: Every day | ORAL | 0 refills | Status: DC

## 2019-10-13 ENCOUNTER — Other Ambulatory Visit: Payer: Self-pay

## 2019-10-13 MED ORDER — CITALOPRAM HYDROBROMIDE 40 MG PO TABS: 40.0000 mg | ORAL_TABLET | Freq: Every day | ORAL | 0 refills | Status: DC

## 2019-11-02 ENCOUNTER — Other Ambulatory Visit: Payer: Self-pay | Admitting: Family Medicine

## 2019-11-12 ENCOUNTER — Other Ambulatory Visit: Payer: Self-pay | Admitting: Family Medicine

## 2019-11-12 DIAGNOSIS — I10 Essential (primary) hypertension: Secondary | ICD-10-CM

## 2019-12-08 ENCOUNTER — Other Ambulatory Visit: Payer: Self-pay | Admitting: Family Medicine

## 2019-12-08 DIAGNOSIS — F411 Generalized anxiety disorder: Secondary | ICD-10-CM

## 2019-12-10 ENCOUNTER — Other Ambulatory Visit: Payer: Self-pay | Admitting: Family Medicine

## 2019-12-10 DIAGNOSIS — I10 Essential (primary) hypertension: Secondary | ICD-10-CM

## 2019-12-11 ENCOUNTER — Telehealth: Payer: Self-pay | Admitting: Family Medicine

## 2019-12-11 DIAGNOSIS — F411 Generalized anxiety disorder: Secondary | ICD-10-CM

## 2019-12-11 MED ORDER — ALPRAZOLAM 0.25 MG PO TABS: 0.2500 mg | ORAL_TABLET | Freq: Three times a day (TID) | ORAL | 2 refills | Status: DC | PRN

## 2019-12-11 NOTE — Telephone Encounter (Signed)
Xanax Prescription sent to pharmacy, please have this filled at every visit as we are not suppose to refill outside of office visit.

## 2019-12-11 NOTE — Telephone Encounter (Signed)
Left detailed message on patients voicemail that rx sent to pharmacy 

## 2019-12-11 NOTE — Telephone Encounter (Signed)
Please advise since patient was seen and no refills were sent in.

## 2019-12-11 NOTE — Telephone Encounter (Signed)
  Prescription Request  12/11/2019  What is the name of the medication or equipment? Xanax  Have you contacted your pharmacy to request a refill? (if applicable) Yes  Which pharmacy would you like this sent to? [facility], [place]  Pt had his last check up with Dr Dettinger on 10/02/19 but no refills were sent to pharmacy at that time because pt already had some left to fill at that time. Pt is now out of Rx and needs refill sent to pharmacy to last him until he comes in for his 3 mth check up on 01/04/20.   Patient notified that their request is being sent to the clinical staff for review and that they should receive a response within 2 business days.

## 2020-01-04 ENCOUNTER — Ambulatory Visit: Payer: Medicare Other | Admitting: Family Medicine

## 2020-01-12 ENCOUNTER — Other Ambulatory Visit: Payer: Self-pay | Admitting: Family Medicine

## 2020-01-12 DIAGNOSIS — I10 Essential (primary) hypertension: Secondary | ICD-10-CM

## 2020-01-15 ENCOUNTER — Encounter: Payer: Self-pay | Admitting: Family Medicine

## 2020-01-15 ENCOUNTER — Ambulatory Visit (INDEPENDENT_AMBULATORY_CARE_PROVIDER_SITE_OTHER): Payer: Medicare Other | Admitting: Family Medicine

## 2020-01-15 ENCOUNTER — Other Ambulatory Visit: Payer: Self-pay

## 2020-01-15 VITALS — BP 128/60 | HR 64 | Temp 97.5°F | Ht 73.0 in | Wt 210.0 lb

## 2020-01-15 DIAGNOSIS — E1169 Type 2 diabetes mellitus with other specified complication: Secondary | ICD-10-CM | POA: Diagnosis not present

## 2020-01-15 DIAGNOSIS — E782 Mixed hyperlipidemia: Secondary | ICD-10-CM

## 2020-01-15 DIAGNOSIS — I10 Essential (primary) hypertension: Secondary | ICD-10-CM

## 2020-01-15 DIAGNOSIS — F411 Generalized anxiety disorder: Secondary | ICD-10-CM

## 2020-01-15 DIAGNOSIS — Z79891 Long term (current) use of opiate analgesic: Secondary | ICD-10-CM | POA: Diagnosis not present

## 2020-01-15 LAB — BAYER DCA HB A1C WAIVED: HB A1C (BAYER DCA - WAIVED): 7.1 % — ABNORMAL HIGH (ref <7.0)

## 2020-01-15 MED ORDER — ALPRAZOLAM 0.25 MG PO TABS: 0.2500 mg | ORAL_TABLET | Freq: Three times a day (TID) | ORAL | 1 refills | Status: AC | PRN

## 2020-01-15 MED ORDER — CLOPIDOGREL BISULFATE 75 MG PO TABS: 75.0000 mg | ORAL_TABLET | Freq: Every day | ORAL | 3 refills | Status: AC

## 2020-01-15 NOTE — Progress Notes (Signed)
BP 128/60   Pulse 64   Temp (!) 97.5 F (36.4 C)   Ht '6\' 1"'  (1.854 m)   Wt 210 lb (95.3 kg)   SpO2 97%   BMI 27.71 kg/m    Subjective:   Patient ID: Dennis Melton, male    DOB: [birth year], 79 y.o.   MRN: [id number]  HPI: Dennis Melton is a 79 y.o. male presenting on 01/15/2020 for No chief complaint on file.   HPI Type 2 diabetes mellitus Patient comes in today for recheck of his diabetes. Patient has been currently taking no medication currently has been diet controlled with improved, A1c 7.1 today. Patient is currently on an ACE inhibitor/ARB. Patient has not seen an ophthalmologist this year. Patient denies any issues with their feet. The symptom started onset as an adult hyperlipidemia and hypertension ARE RELATED TO DM   Hypertension Patient is currently on amlodipine and atenolol, and their blood pressure today is 128/60. Patient denies any lightheadedness or dizziness. Patient denies headaches, blurred vision, chest pains, shortness of breath, or weakness. Denies any side effects from medication and is content with current medication.   Hyperlipidemia Patient is coming in for recheck of his hyperlipidemia. The patient is currently taking omega-3's atorvastatin. They deny any issues with myalgias or history of liver damage from it. They deny any focal numbness or weakness or chest pain.   Anxiety Current rx-Xanax 0.25 3 times daily as needed # meds rx-90 Effectiveness of current meds-working well, also takes citalopram Adverse reactions form meds-none  Pill count performed-No Last drug screen -12/11/2018 ( high risk q40m moderate risk q636mlow risk yearly ) Urine drug screen today- Yes [place]  If yes were their any concerning findings? -None  No flowsheet data found.   Controlled substance contract signed on: Today [facility] 01/15/2020 10/02/2019 06/08/2019 12/04/2018 05/29/2018  Decreased Interest '1 1 2 3 1  ' Down, Depressed, Hopeless '3 3  3 3 ' 0  PHQ - 2 Score '4 4 5 6 1  ' Altered sleeping '2 3 1 3 ' -  Tired, decreased energy '3 3 1 3 ' -  Change in appetite 0 0 0 2 -  Feeling bad or failure about yourself  - 0 1 3 -  Trouble concentrating 3 0 0 0 -  Moving slowly or fidgety/restless 0 0 0 0 -  Suicidal thoughts 0 0 0 0 -  PHQ-9 Score '12 10 8 17 ' -  Difficult doing work/chores - Somewhat difficult Somewhat difficult - -  Some recent data might be hidden     Relevant past medical, surgical, family and social history reviewed and updated as indicated. Interim medical history since our last visit reviewed. Allergies and medications reviewed and updated.  Review of Systems  Constitutional: Negative for chills and fever.  Eyes: Negative for visual disturbance.  Respiratory: Negative for shortness of breath and wheezing.   Cardiovascular: Negative for chest pain and leg swelling.  Musculoskeletal: Negative for back pain and gait problem.  Skin: Negative for rash.  Neurological: Negative for dizziness, weakness and numbness.  All other systems reviewed and are negative.   Per HPI unless specifically indicated above   Allergies as of 01/15/2020      Reactions   Codeine Other (See Comments)   Heart racing      Medication List       Accurate as of January 15, 2020  1:38 PM. If you have any questions, ask your nurse or doctor.  ALPRAZolam 0.25 MG tablet Commonly known as: XANAX Take 1 tablet (0.25 mg total) by mouth 3 (three) times daily as needed.   amLODipine 10 MG tablet Commonly known as: NORVASC TAKE 1 TABLET BY MOUTH  DAILY WITH BREAKFAST   aspirin EC 81 MG tablet Take 81 mg by mouth daily. Reported on 11/17/2015   Aspirin-Caffeine 845-65 MG Pack Take 1 Package by mouth as needed (Pain).   atenolol 25 MG tablet Commonly known as: TENORMIN TAKE 1 TABLET BY MOUTH ONCE DAILY IN THE MORNING   atorvastatin 20 MG tablet Commonly known as: LIPITOR Take 1 tablet (20 mg total) by mouth daily. (Needs to be  seen before next refill)   cholecalciferol 1000 units tablet Commonly known as: VITAMIN D Take 1,000 Units by mouth daily.   citalopram 40 MG tablet Commonly known as: CELEXA TAKE 1 TABLET BY MOUTH  DAILY   clopidogrel 75 MG tablet Commonly known as: PLAVIX Take 1 tablet (75 mg total) by mouth daily.   doxepin 10 MG capsule Commonly known as: SINEQUAN Take 1 capsule (10 mg total) by mouth at bedtime as needed.   ferrous sulfate 325 (65 FE) MG EC tablet Take 1 tablet by mouth once daily   finasteride 5 MG tablet Commonly known as: PROSCAR TAKE 1 TABLET BY MOUTH  DAILY   Fish Oil 1200 MG Caps Take 1 capsule by mouth 2 (two) times daily.   omeprazole 20 MG capsule Commonly known as: PRILOSEC Take 1 capsule (20 mg total) by mouth 2 (two) times daily before a meal. (Needs to be seen before next refill)   tamsulosin 0.4 MG Caps capsule Commonly known as: FLOMAX TAKE 1 CAPSULE BY MOUTH  DAILY   traZODone 100 MG tablet Commonly known as: DESYREL Take 1-2 tablets (100-200 mg total) by mouth at bedtime as needed for sleep.   valsartan-hydrochlorothiazide 160-12.5 MG tablet Commonly known as: DIOVAN-HCT TAKE 1 TABLET BY MOUTH  DAILY   venlafaxine XR 150 MG 24 hr capsule Commonly known as: Effexor XR Take 1 capsule (150 mg total) by mouth daily with breakfast.        Objective:   BP 128/60   Pulse 64   Temp (!) 97.5 F (36.4 C)   Ht '6\' 1"'  (1.854 m)   Wt 210 lb (95.3 kg)   SpO2 97%   BMI 27.71 kg/m   Wt Readings from Last 3 Encounters:  01/15/20 210 lb (95.3 kg)  10/02/19 204 lb (92.5 kg)  12/04/18 190 lb 9.6 oz (86.5 kg)    Physical Exam Vitals and nursing note reviewed.  Constitutional:      General: He is not in acute distress.    Appearance: He is well-developed. He is not diaphoretic.  Eyes:     General: No scleral icterus.    Conjunctiva/sclera: Conjunctivae normal.  Neck:     Thyroid: No thyromegaly.  Cardiovascular:     Rate and Rhythm: Normal  rate and regular rhythm.     Heart sounds: Normal heart sounds. No murmur heard.   Pulmonary:     Effort: Pulmonary effort is normal. No respiratory distress.     Breath sounds: Normal breath sounds. No wheezing.  Musculoskeletal:        General: Normal range of motion.     Cervical back: Neck supple.  Lymphadenopathy:     Cervical: No cervical adenopathy.  Skin:    General: Skin is warm and dry.     Findings: No rash.  Neurological:  Mental Status: He is alert and oriented to person, place, and time.     Coordination: Coordination normal.  Psychiatric:        Behavior: Behavior normal.       Assessment & Plan:   Problem List Items Addressed This Visit      Cardiovascular and Mediastinum   HTN (hypertension)   Relevant Orders   CMP14+EGFR     Endocrine   T2DM (type 2 diabetes mellitus) ([place]) - Primary   Relevant Orders   Bayer DCA Hb A1c Waived   CBC with Differential/Platelet     Other   HLD (hyperlipidemia)   Relevant Orders   Lipid panel   Generalized anxiety disorder   Relevant Medications   ALPRAZolam (XANAX) 0.25 MG tablet (Start on 03/11/2020)   Other Relevant Orders   ToxASSURE Select 13 (MW), Urine      Continue current medication, patient seems to be doing well, follow-up in 3 months. Follow up plan: Return in about 3 months (around 04/15/2020), or if symptoms worsen or fail to improve, for diabetes and htn and hld.  Counseling provided for all of the vaccine components Orders Placed This Encounter  Procedures  . [facility] Hb A1c [place], MD [facility] 01/15/2020, 1:38 PM

## 2020-01-16 LAB — CMP14+EGFR
ALT: 57 IU/L — ABNORMAL HIGH (ref 0–44)
AST: 54 IU/L — ABNORMAL HIGH (ref 0–40)
Albumin/Globulin Ratio: 1.5 (ref 1.2–2.2)
Albumin: 4.4 g/dL (ref 3.7–4.7)
Alkaline Phosphatase: 182 IU/L — ABNORMAL HIGH (ref 48–121)
BUN/Creatinine Ratio: 9 — ABNORMAL LOW (ref 10–24)
BUN: 9 mg/dL (ref 8–27)
Bilirubin Total: 0.4 mg/dL (ref 0.0–1.2)
CO2: 25 mmol/L (ref 20–29)
Calcium: 9.5 mg/dL (ref 8.6–10.2)
Chloride: 98 mmol/L (ref 96–106)
Creatinine, Ser: 1 mg/dL (ref 0.76–1.27)
GFR calc Af Amer: 83 mL/min/{1.73_m2} (ref >59)
GFR calc non Af Amer: 72 mL/min/{1.73_m2} (ref >59)
Globulin, Total: 3 g/dL (ref 1.5–4.5)
Glucose: 129 mg/dL — ABNORMAL HIGH (ref 65–99)
Potassium: 5.1 mmol/L (ref 3.5–5.2)
Sodium: 137 mmol/L (ref 134–144)
Total Protein: 7.4 g/dL (ref 6.0–8.5)

## 2020-01-16 LAB — CBC WITH DIFFERENTIAL/PLATELET
Basophils Absolute: 0.1 10*3/uL (ref 0.0–0.2)
Basos: 1 %
EOS (ABSOLUTE): 0.2 10*3/uL (ref 0.0–0.4)
Eos: 3 %
Hematocrit: 39.5 % (ref 37.5–51.0)
Hemoglobin: 13 g/dL (ref 13.0–17.7)
Immature Grans (Abs): 0 10*3/uL (ref 0.0–0.1)
Immature Granulocytes: 0 %
Lymphocytes Absolute: 1.4 10*3/uL (ref 0.7–3.1)
Lymphs: 26 %
MCH: 28.6 pg (ref 26.6–33.0)
MCHC: 32.9 g/dL (ref 31.5–35.7)
MCV: 87 fL (ref 79–97)
Monocytes Absolute: 0.5 10*3/uL (ref 0.1–0.9)
Monocytes: 10 %
Neutrophils Absolute: 3.1 10*3/uL (ref 1.4–7.0)
Neutrophils: 60 %
Platelets: 164 10*3/uL (ref 150–450)
RBC: 4.54 x10E6/uL (ref 4.14–5.80)
RDW: 15.6 % — ABNORMAL HIGH (ref 11.6–15.4)
WBC: 5.2 10*3/uL (ref 3.4–10.8)

## 2020-01-16 LAB — LIPID PANEL
Chol/HDL Ratio: 3.6 ratio (ref 0.0–5.0)
Cholesterol, Total: 130 mg/dL (ref 100–199)
HDL: 36 mg/dL — ABNORMAL LOW (ref >39)
LDL Chol Calc (NIH): 61 mg/dL (ref 0–99)
Triglycerides: 203 mg/dL — ABNORMAL HIGH (ref 0–149)
VLDL Cholesterol Cal: 33 mg/dL (ref 5–40)

## 2020-01-19 LAB — TOXASSURE SELECT 13 (MW), URINE

## 2020-03-14 ENCOUNTER — Other Ambulatory Visit: Payer: Self-pay | Admitting: Family Medicine

## 2020-03-24 ENCOUNTER — Telehealth: Payer: Self-pay

## 2020-03-24 DIAGNOSIS — E782 Mixed hyperlipidemia: Secondary | ICD-10-CM

## 2020-03-24 DIAGNOSIS — E785 Hyperlipidemia, unspecified: Secondary | ICD-10-CM

## 2020-03-24 MED ORDER — ATORVASTATIN CALCIUM 20 MG PO TABS: 20.0000 mg | ORAL_TABLET | Freq: Every day | ORAL | 0 refills | Status: AC

## 2020-03-24 MED ORDER — CITALOPRAM HYDROBROMIDE 40 MG PO TABS: 40.0000 mg | ORAL_TABLET | Freq: Every day | ORAL | 0 refills | Status: AC

## 2020-03-24 NOTE — Telephone Encounter (Signed)
Verified with patient that he is taking Citalopram and not Venlafaxine Aware refills sent to [facility]

## 2020-03-24 NOTE — Telephone Encounter (Signed)
  Prescription Request  03/24/2020  What is the name of the medication or equipment? Atorvastin-20 mg & Venlafaxine-150 mg  Have you contacted your pharmacy to request a refill? (if applicable) no  Which pharmacy would you like this sent to? [facility]   Patient notified that their request is being sent to the clinical staff for review and that they should receive a response within 2 business days.   Dettinger's pt.  Please call him

## 2020-04-15 ENCOUNTER — Ambulatory Visit: Payer: Medicare Other | Admitting: Family Medicine

## 2020-05-09 ENCOUNTER — Other Ambulatory Visit: Payer: Self-pay | Admitting: Family Medicine

## 2020-05-09 DIAGNOSIS — F411 Generalized anxiety disorder: Secondary | ICD-10-CM

## 2020-05-13 ENCOUNTER — Other Ambulatory Visit: Payer: Self-pay | Admitting: Family

## 2020-05-13 DIAGNOSIS — F411 Generalized anxiety disorder: Secondary | ICD-10-CM

## 2020-05-17 ENCOUNTER — Telehealth: Payer: Self-pay | Admitting: Family Medicine

## 2020-05-25 ENCOUNTER — Ambulatory Visit: Payer: Medicare Other | Admitting: Family Medicine

## 2020-06-14 NOTE — Telephone Encounter (Signed)
Patient's time of death called in from EMS, found on commode, time of death was 11:16 AM on Jun 14, 2020

## 2020-06-14 DEATH — deceased

## 2022-10-01 NOTE — Telephone Encounter (Signed)
Erroneous encounter will close.
